# Patient Record
Sex: Female | Born: 1975 | Race: Asian | Hispanic: No | Marital: Married | Smoking: Never smoker
Health system: Southern US, Community
[De-identification: ages and names within clinical notes are randomized; demographics above are authoritative.]

## PROBLEM LIST (undated history)

## (undated) HISTORY — PX: ABDOMINAL HYSTERECTOMY: SHX81

---

## 2006-09-30 ENCOUNTER — Emergency Department (HOSPITAL_COMMUNITY): Admission: EM | Admit: 2006-09-30 | Discharge: 2006-09-30 | Payer: Self-pay | Admitting: Family Medicine

## 2007-04-07 ENCOUNTER — Emergency Department (HOSPITAL_COMMUNITY): Admission: EM | Admit: 2007-04-07 | Discharge: 2007-04-07 | Payer: Self-pay | Admitting: Family Medicine

## 2008-04-30 ENCOUNTER — Emergency Department (HOSPITAL_COMMUNITY): Admission: EM | Admit: 2008-04-30 | Discharge: 2008-04-30 | Payer: Self-pay | Admitting: Family Medicine

## 2008-12-10 ENCOUNTER — Emergency Department (HOSPITAL_COMMUNITY): Admission: EM | Admit: 2008-12-10 | Discharge: 2008-12-10 | Payer: Self-pay | Admitting: Family Medicine

## 2009-08-05 ENCOUNTER — Emergency Department (HOSPITAL_COMMUNITY): Admission: EM | Admit: 2009-08-05 | Discharge: 2009-08-05 | Payer: Self-pay | Admitting: Family Medicine

## 2009-10-07 ENCOUNTER — Emergency Department (HOSPITAL_COMMUNITY): Admission: EM | Admit: 2009-10-07 | Discharge: 2009-10-07 | Payer: Self-pay | Admitting: Family Medicine

## 2010-04-14 ENCOUNTER — Emergency Department (HOSPITAL_COMMUNITY)
Admission: EM | Admit: 2010-04-14 | Discharge: 2010-04-14 | Payer: Self-pay | Source: Home / Self Care | Admitting: Emergency Medicine

## 2010-04-21 ENCOUNTER — Emergency Department (HOSPITAL_COMMUNITY)
Admission: EM | Admit: 2010-04-21 | Discharge: 2010-04-21 | Payer: Self-pay | Source: Home / Self Care | Admitting: Family Medicine

## 2010-04-21 LAB — POCT URINALYSIS DIPSTICK
Bilirubin Urine: NEGATIVE
Ketones, ur: NEGATIVE mg/dL
Urine Glucose, Fasting: NEGATIVE mg/dL

## 2010-04-21 LAB — POCT PREGNANCY, URINE: Preg Test, Ur: NEGATIVE

## 2010-06-10 LAB — POCT RAPID STREP A (OFFICE): Streptococcus, Group A Screen (Direct): NEGATIVE

## 2010-06-13 LAB — URINE CULTURE: Culture: NO GROWTH

## 2010-06-13 LAB — POCT URINALYSIS DIP (DEVICE)
Glucose, UA: 100 mg/dL — AB
Nitrite: NEGATIVE
Urobilinogen, UA: 4 mg/dL — ABNORMAL HIGH (ref 0.0–1.0)

## 2010-06-13 LAB — GLUCOSE, CAPILLARY: Glucose-Capillary: 133 mg/dL — ABNORMAL HIGH (ref 70–99)

## 2010-06-13 LAB — POCT PREGNANCY, URINE: Preg Test, Ur: NEGATIVE

## 2010-08-07 ENCOUNTER — Inpatient Hospital Stay (INDEPENDENT_AMBULATORY_CARE_PROVIDER_SITE_OTHER)
Admission: RE | Admit: 2010-08-07 | Discharge: 2010-08-07 | Disposition: A | Discharge status: Home / Self Care | Payer: Managed Care, Other (non HMO) | Source: Ambulatory Visit | Attending: Family Medicine | Admitting: Family Medicine

## 2010-08-07 DIAGNOSIS — K5289 Other specified noninfective gastroenteritis and colitis: Secondary | ICD-10-CM

## 2010-08-07 LAB — POCT URINALYSIS DIP (DEVICE)
Glucose, UA: NEGATIVE mg/dL
Ketones, ur: 40 mg/dL — AB
Specific Gravity, Urine: 1.025 (ref 1.005–1.030)
Urobilinogen, UA: 1 mg/dL (ref 0.0–1.0)

## 2010-08-07 LAB — POCT PREGNANCY, URINE: Preg Test, Ur: NEGATIVE

## 2011-09-04 ENCOUNTER — Emergency Department (INDEPENDENT_AMBULATORY_CARE_PROVIDER_SITE_OTHER)
Admission: EM | Admit: 2011-09-04 | Discharge: 2011-09-04 | Disposition: A | Discharge status: Home / Self Care | Payer: Managed Care, Other (non HMO) | Source: Home / Self Care | Attending: Emergency Medicine | Admitting: Emergency Medicine

## 2011-09-04 ENCOUNTER — Encounter (HOSPITAL_COMMUNITY): Payer: Self-pay

## 2011-09-04 DIAGNOSIS — J209 Acute bronchitis, unspecified: Secondary | ICD-10-CM

## 2011-09-04 DIAGNOSIS — J069 Acute upper respiratory infection, unspecified: Secondary | ICD-10-CM

## 2011-09-04 MED ORDER — AZITHROMYCIN 250 MG PO TABS: ORAL_TABLET | ORAL | Status: AC

## 2011-09-04 MED ORDER — BENZONATATE 200 MG PO CAPS: 200.0000 mg | ORAL_CAPSULE | Freq: Three times a day (TID) | ORAL | Status: AC | PRN

## 2011-09-04 MED ORDER — FEXOFENADINE-PSEUDOEPHED ER 60-120 MG PO TB12: 1.0000 | ORAL_TABLET | Freq: Two times a day (BID) | ORAL | Status: DC

## 2011-09-04 NOTE — ED Notes (Signed)
C/o cough, congestion, ST, chills, sneezing since last week

## 2011-09-04 NOTE — Discharge Instructions (Signed)
If you have no primary doctor, here are some resources that may be helpful:  Medicaid-accepting [facility] Providers:   - [facility]- [address]      [phone]      Mon-Fri 9am-7pm, Sat 9am-1pm   - [facility]- [address] [place], [place]      [phone]   - [facility]- [address] [place]      [phone]   [facility]- [address]      [phone]   - Renaye Rakers- [address] [place], [address]      [phone]      Only accepts [place] patients       after they have her name applied to their card   Self Pay (no insurance) in [place]:   - Sickle Cell Patients: Dr Willey Blade, [facility]      [address] [place]      [phone]   - [phone]   - Physician Referral Service- [phone]   - [facility]- [address] [place]      [phone]   [facility] [place]- [address]   - [facility]- see information above      (Speak to [employer] if you do not have insurance)   - [facility]- [address] [place]      [phone]   - [facility] [place]- [address]      [phone]   - [facility]- [address]      [phone]   - Dr Julio Sicks-  [address], [place]      [phone]   - [facility]- [address]      [phone]   - [facility]- [address]      [phone]      Also [address]      [phone]   - [facility]- [address]      [phone]      1st and 3rd Saturday every month, 10am-1pm Other agencies that provide inexpensive medical care:    [facility]  [phone]    [facility]  [phone]    [facility]  [phone]    [facility]  [phone]    [facility]  [phone]  General Information: Finding a doctor when you do  not have health insurance can be tricky. Although you are not limited by an insurance plan, you are of course limited by her finances and how much but he can pay out of pocket.  What are your options if you don't have health insurance?   1) Find a Librarian, academic and Pay Out of Pocket Although you won't have to find out who is covered by your insurance plan, it is a good idea to ask around and get recommendations. You will then need to call the office and see if the doctor you have chosen will accept you as a new patient and what types of options they offer for patients who are self-pay. Some doctors offer discounts or will set up payment plans for their patients who do not have insurance, but you will need to ask so you aren't surprised when you get to your appointment.  2) Contact Your Local Health Department Not all health departments have doctors that can see patients for sick visits, but many do, so it is worth a call to see if yours does. If you don't know where your local health department is, you can check in your phone book. The CDC also has a tool to help you locate your state's health department, and many state websites also have listings of all of their local health departments.  3) Find a Walk-in Clinic If your illness is not likely to be very severe or complicated, you may want to try a walk in clinic. These are popping up all over the country in pharmacies, drugstores, and shopping centers. They're usually staffed by nurse practitioners or physician assistants that have been trained to treat common illnesses and complaints. They're usually fairly quick and inexpensive. However, if you have serious medical issues or chronic medical problems, these are probably not your best option   Most upper respiratory infections are caused by viruses and do not require antibiotics.  We try to save the antibiotics for when we really need them to avoid resistance.  This does not mean that there is nothing that  can be done.  Here are a few hints about things that can be done at home to get over an upper respiratory infection quicker:  Get extra sleep and extra fluids.  Get 7 to 9 hours of sleep per night and 6 to 8 glasses of water a day.  Getting extra sleep keeps the immune system from getting run down.  Most people with an upper respiratory infection are a little dehydrated.  The extra fluids also keep the secretions liquified and easier to deal with.  Also, get extra vitamin C.  4000 mg per day is the recommended dose. For the aches, headache, and fever, acetaminophen or ibuprofen are helpful.  These can be alternated every 4 hours.  People with liver disease should avoid large amounts of acetaminophen, and people with ulcer disease, gastroesophageal reflux, gastritis, congestive heart failure, chronic kidney disease, coronary artery disease and the elderly should avoid ibuprofen. For nasal congestion try Mucinex-D, or if you're having lots of sneezing or copious clear nasal drainage Allegra-D-24 hour.  A Saline nasal spray such as Ocean Spray can also help as can decongestant sprays such as Afrin, but you should not use the decongestant sprays for more than 3 or 4 days since they can be habituating.  If nasal dryness is a problem, Ayr Nasal Gel can help moisturize your nasal passages.  Breath Rite nasal strips can also offer a non-drug alternative treatment to nasal congestion, especially at night. For people with symptoms of sinusitis, sleeping with your head elevated can be helpful.  For sinus pain, moist, hot compresses to the face may provide some relief.  Many people find that inhaling steam as in a shower or from a pot of steaming water can help. For sore throat, zinc containing lozenges such as Cold-Eze or Zicam are helpful.  Zinc helps to fight infection and has a mild astringent effect that relieves the sore, achey throat.  Hot salt water gargles (8 oz of hot water, 1/2 tsp of table salt, and a pinch of  baking soda) can give relief as well as hot beverages such as hot tea. For the cough, old time remedies such as honey or honey and lemon are tried and true.  Over the counter cough syrups such as Delsym 2 tsp every 12 hours can help as  well.  It has also been found recently that Aleve can help control a cough.  The dose is 1 to 2 tablets twice daily with food.  This can be combined with Delsym. (Note, if you are taking ibuprofen, you should not take Aleve as well--take one or the other.)  It's important when you have an upper respiratory infection not to pass the infection to others.  This involves being very careful about the following:  Frequent hand washing or use of hand sanitizer, especially after coughing, sneezing, blowing your nose or touching your face, nose or eyes. Do not shake hands or touch anyone and try to avoid touching surfaces that other people use such as doorknobs, shopping carts, telephones and computer keyboards. Use tissues and dispose of them properly in a garbage can or ziplock bag. Cough into your sleeve. Do not let others eat or drink after you.  It's also important to recognize the signs of serious illness and get evaluated if they occur: Any respiratory infection that lasts more than 7 to 10 days.  Yellow nasal drainage and sputum are not reliable indicators of a bacterial infection, but if they last for more than 1 week, see your doctor. Fever and sore throat can indicate strep. Fever and cough can indicate influenza or pneumonia. Any kind of severe symptom such as difficulty breathing, intractable vomiting, or severe pain should prompt you to see a doctor as soon as possible.   Your body's immune system is really the thing that will get rid of this infection.  Your immune system is comprised of 2 types of specialized cells called T cells and B cells.  T cells coordinate the array of cells in your body that engulf invading bacteria or viruses while B cells orchestrate  the production of antibodies that neutralize infection.  Anything we do or any medications we give you, will just strengthen your immune system or help it clear up the infection quicker.  Here are a few helpful hints to improve your immune system to help overcome this illness or to prevent future infections:  A few vitamins can improve the health of your immune system.  That's why your diet should include plenty of fruits, vegetables, fish, nuts, and whole grains.  Vitamin A and bet-carotene can increase the cells that fight infections (T cells and B cells).  Vitamin A is abundant in dark greens and orange vegetables such as spinach, greens, sweet potatoes, and carrots.  Vitamin B6 contributes to the maturation of white blood cells, the cells that fight disease.  Foods with vitamin B6 include cold cereal and bananas.  Vitamin C is credited with preventing colds because it increases white blood cells and also prevents cellular damage.  Citrus fruits, peaches and green and red bell peppers are all hight in vitamin C.  Vitamin E is an anti-oxidant that encourages the production of natural killer cells which reject foreign invaders and B cells that produce antibodies.  Foods high in vitamin E include wheat germ, nuts and seeds.  Foods high in omega-3 fatty acids found in foods like salmon, tuna and mackerel boost your immune system and help cells to engulf and absorb germs.  Probiotics are good bacteria that increase your T cells.  These can be found in yogurt and are available in supplements such as Culturelle or Align.  Moderate exercise increases the strength of your immune system and your ability to recover from illness.  I suggest 3 to 5 moderate intensity 30 minute workouts  per week.    Sleep is another component of maintaining a strong immune system.  It enables your body to recuperate from the day's activities, stress and work.  My recommendation is to get between 7 and 9 hours of sleep per  night.  If you smoke, try to quit completely or at least cut down.  Drink alcohol only in moderation if at all.  No more than 2 drinks daily for men or 1 for women.  Get a flu vaccine early in the fall or if you have not gotten one yet, once this illness has run its course.  If you are over 65, a smoker, or an asthmatic, get a pneumococcal vaccine.  My final recommendation is to maintain a healthy weight.  Excess weight can impair the immune system by interfering with the way the immune system deals with invading viruses or bacteria.

## 2011-09-04 NOTE — ED Provider Notes (Signed)
Chief Complaint  Patient presents with  . Cough    History of Present Illness:   The patient is a 36 year old female who has had a three-day history of dry, nonproductive cough, aching in her chest when she coughs, dizziness, sneezing, nasal congestion, rhinorrhea, low-grade fever, sore throat, right ear pain, and headache. She denies any wheezing, nausea, vomiting, or diarrhea.  Review of Systems:  Other than noted above, the patient denies any of the following symptoms. Systemic:  No fever, chills, sweats, fatigue, myalgias, headache, or anorexia. Eye:  No redness, pain or drainage. ENT:  No earache, ear congestion, nasal congestion, sneezing, rhinorrhea, sinus pressure, sinus pain, post nasal drip, or sore throat. Lungs:  No cough, sputum production, wheezing, shortness of breath, or chest pain. GI:  No abdominal pain, nausea, vomiting, or diarrhea. Skin:  No rash or itching.  PMFSH:  Past medical history, family history, social history, meds, and allergies were reviewed.  Physical Exam:   Vital signs:  BP 107/70  Pulse 64  Temp(Src) 97.9 F (36.6 C) (Oral)  Resp 18  SpO2 97%  LMP 08/29/2011 General:  Alert, in no distress. Eye:  No conjunctival injection or drainage. Lids were normal. ENT:  TMs and canals were normal, without erythema or inflammation.  Nasal mucosa was clear and uncongested, without drainage.  Mucous membranes were moist.  Pharynx was clear, without exudate or drainage.  There were no oral ulcerations or lesions. Neck:  Supple, no adenopathy, tenderness or mass. Lungs:  No respiratory distress.  Lungs were clear to auscultation, without wheezes, rales or rhonchi.  Breath sounds were clear and equal bilaterally. Lungs were resonant to percussion.  No egophony. Heart:  Regular rhythm, without gallops, murmers or rubs. Skin:  Clear, warm, and dry, without rash or lesions.  Assessment:  The primary encounter diagnosis was Acute bronchitis. A diagnosis of Viral upper  respiratory tract infection was also pertinent to this visit.  Plan:   1.  The following meds were prescribed:   New Prescriptions   AZITHROMYCIN (ZITHROMAX Z-PAK) 250 MG TABLET    Take as directed.   BENZONATATE (TESSALON) 200 MG CAPSULE    Take 1 capsule (200 mg total) by mouth 3 (three) times daily as needed for cough.   FEXOFENADINE-PSEUDOEPHEDRINE (ALLEGRA-D) 60-120 MG PER TABLET    Take 1 tablet by mouth every 12 (twelve) hours.   2.  The patient was instructed in symptomatic care and handouts were given. 3.  The patient was told to return if becoming worse in any way, if no better in 3 or 4 days, and given some red flag symptoms that would indicate earlier return.   Reuben Likes, MD 09/04/11 [phone]

## 2011-11-01 ENCOUNTER — Encounter (HOSPITAL_COMMUNITY): Payer: Self-pay | Admitting: *Deleted

## 2011-11-01 ENCOUNTER — Emergency Department (INDEPENDENT_AMBULATORY_CARE_PROVIDER_SITE_OTHER)
Admission: EM | Admit: 2011-11-01 | Discharge: 2011-11-01 | Disposition: A | Discharge status: Home / Self Care | Payer: Managed Care, Other (non HMO) | Source: Home / Self Care | Attending: Emergency Medicine | Admitting: Emergency Medicine

## 2011-11-01 DIAGNOSIS — N390 Urinary tract infection, site not specified: Secondary | ICD-10-CM

## 2011-11-01 LAB — POCT URINALYSIS DIP (DEVICE)
Glucose, UA: NEGATIVE mg/dL
Specific Gravity, Urine: 1.025 (ref 1.005–1.030)
Urobilinogen, UA: 1 mg/dL (ref 0.0–1.0)

## 2011-11-01 MED ORDER — NITROFURANTOIN MACROCRYSTAL 100 MG PO CAPS: 100.0000 mg | ORAL_CAPSULE | Freq: Two times a day (BID) | ORAL | Status: AC

## 2011-11-01 NOTE — ED Provider Notes (Signed)
History     CSN: [id number]  Arrival date & time 11/01/11  1545   First MD Initiated Contact with Patient 11/01/11 1616      Chief Complaint  Patient presents with  . Urinary Tract Infection    (Consider location/radiation/quality/duration/timing/severity/associated sxs/prior treatment) HPI Comments: Patient comes in complaining of lower abdominal pain as in pressure related to urination. burning and pressure with urination. Patient tries to urinate and urinates small amounts at a time. Denies any vomiting, fevers or flank pain. Patient is accompanied by her daughter that is acting as Customer service manager. She feels "is a lot of burning with urination". Patient denies any diarrheas, no vaginal discharge or bleeding.  Patient is a 36 y.o. female presenting with urinary tract infection. The history is provided by the patient and a relative. The history is limited by a language barrier. No language interpreter was used.  Urinary Tract Infection This is a new problem. The current episode started 2 days ago. The problem occurs constantly. The problem has been gradually worsening. Associated symptoms include abdominal pain. Pertinent negatives include no shortness of breath. Exacerbated by: Urinating. Nothing relieves the symptoms. She has tried nothing for the symptoms.    History reviewed. No pertinent past medical history.  Past Surgical History  Procedure Date  . Abdominal hysterectomy     Family History  Problem Relation Age of Onset  . Family history unknown: Yes    History  Substance Use Topics  . Smoking status: Never Smoker   . Smokeless tobacco: Not on file  . Alcohol Use: No    OB History    Grav Para Term Preterm Abortions TAB SAB Ect Mult Living                  Review of Systems  Constitutional: Negative for fever, chills, activity change, appetite change and fatigue.  Respiratory: Negative for shortness of breath.   Gastrointestinal: Positive for abdominal  pain. Negative for vomiting, blood in stool and rectal pain.  Genitourinary: Positive for dysuria, urgency and frequency. Negative for hematuria, flank pain, vaginal bleeding, vaginal discharge, enuresis, vaginal pain and dyspareunia.  Skin: Negative for rash.    Allergies  Review of patient's allergies indicates no known allergies.  Home Medications   Current Outpatient Rx  Name Route Sig Dispense Refill  . FEXOFENADINE-PSEUDOEPHED ER 60-120 MG PO TB12 Oral Take 1 tablet by mouth every 12 (twelve) hours. 30 tablet 0  . NITROFURANTOIN MACROCRYSTAL 100 MG PO CAPS Oral Take 1 capsule (100 mg total) by mouth 2 (two) times daily. 14 capsule 0    BP 103/72  Pulse 68  Temp 97.9 F (36.6 C) (Oral)  Resp 18  SpO2 100%  LMP 08/29/2011  Physical Exam  Nursing note and vitals reviewed. Constitutional: Vital signs are normal. She appears well-developed and well-nourished.  Non-toxic appearance. She does not have a sickly appearance. She does not appear ill. No distress.  HENT:  Head: Normocephalic.  Eyes: Conjunctivae are normal.  Neck: Normal range of motion. Neck supple.  Pulmonary/Chest: Effort normal.  Abdominal: She exhibits no mass. There is tenderness in the suprapubic area. There is no rigidity, no rebound, no guarding and no CVA tenderness.  Neurological: She is alert.  Skin: No erythema.    ED Course  Procedures (including critical care time)  Labs Reviewed  POCT URINALYSIS DIP (DEVICE) - Abnormal; Notable for the following:    Leukocytes, UA SMALL (*)  Biochemical Testing Only. Please order routine urinalysis from  main lab if confirmatory testing is needed.   All other components within normal limits   No results found.   1. Urinary tract infection       MDM   Patient was comfortable, afebrile minimal tenderness suprapubically. Symptomatic suggestive of a urinary tract infection. Will start patient on Macrodantin. I explained to daughter what symptoms should  warrant further evaluation if no improvement or worsening she should return for a recheck.       Jimmie Molly, MD 11/01/11 [phone]

## 2011-11-01 NOTE — ED Notes (Signed)
Pt reports lower abdomen pain and pain upon urination, only small amounts at a time per pt -with aid of daughter translating

## 2012-05-31 ENCOUNTER — Emergency Department (INDEPENDENT_AMBULATORY_CARE_PROVIDER_SITE_OTHER)
Admission: EM | Admit: 2012-05-31 | Discharge: 2012-05-31 | Disposition: A | Discharge status: Home / Self Care | Payer: Managed Care, Other (non HMO) | Source: Home / Self Care

## 2012-05-31 ENCOUNTER — Encounter (HOSPITAL_COMMUNITY): Payer: Self-pay | Admitting: *Deleted

## 2012-05-31 DIAGNOSIS — J309 Allergic rhinitis, unspecified: Secondary | ICD-10-CM

## 2012-05-31 MED ORDER — FEXOFENADINE-PSEUDOEPHED ER 60-120 MG PO TB12: 1.0000 | ORAL_TABLET | Freq: Two times a day (BID) | ORAL | Status: AC

## 2012-05-31 MED ORDER — FLUTICASONE PROPIONATE 50 MCG/ACT NA SUSP: 2.0000 | Freq: Every day | NASAL | Status: DC

## 2012-05-31 NOTE — ED Notes (Signed)
Patient Demographics  Denise Barron, is a 37 y.o. female  ZOX:[id number]  WJX:[id number]  DOB - 1975-07-20  Chief Complaint  Patient presents with  . URI        Subjective:   Denise Barron patient here with one-week history of runny nose, postnasal drip, cough, subjective fevers however no temperature here, no body aches no exposure to sick contacts. She has history of seasonal allergies in the past.  Objective:    Filed Vitals:   05/31/12 1459 05/31/12 1504  BP: 113/73 130/85  Pulse: 64 73  Temp: 97.8 F (36.6 C) 98.2 F (36.8 C)  TempSrc: Oral Oral  Resp: 17 17  SpO2: 98% 96%     Exam  Awake Alert, Oriented X 3, No new F.N deficits, Normal affect [place].AT,PERRAL, mild postnasal drip. Supple Neck,No JVD, No cervical lymphadenopathy appriciated.  Symmetrical Chest wall movement, Good air movement bilaterally, CTAB RRR,No Gallops,Rubs or new Murmurs, No Parasternal Heave +ve B.Sounds, Abd Soft, Non tender, No organomegaly appriciated, No rebound - guarding or rigidity. No Cyanosis, Clubbing or edema, No new Rash or bruise       Data Review   CBC No results found for this basename: WBC, HGB, HCT, PLT, MCV, MCH, MCHC, RDW, NEUTRABS, LYMPHSABS, MONOABS, EOSABS, BASOSABS, BANDABS, BANDSABD,  in the last 168 hours  Chemistries   No results found for this basename: NA, K, CL, CO2, GLUCOSE, BUN, CREATININE, GFRCGP, CALCIUM, MG, AST, ALT, ALKPHOS, BILITOT,  in the last 168 hours ------------------------------------------------------------------------------------------------------------------ No results found for this basename: HGBA1C,  in the last 72 hours ------------------------------------------------------------------------------------------------------------------ No results found for this basename: CHOL, HDL, LDLCALC, TRIG, CHOLHDL, LDLDIRECT,  in the last 72  hours ------------------------------------------------------------------------------------------------------------------ No results found for this basename: TSH, T4TOTAL, FREET3, T3FREE, THYROIDAB,  in the last 72 hours ------------------------------------------------------------------------------------------------------------------ No results found for this basename: VITAMINB12, FOLATE, FERRITIN, TIBC, IRON, RETICCTPCT,  in the last 72 hours  Coagulation profile  No results found for this basename: INR, PROTIME,  in the last 168 hours     Prior to Admission medications   Medication Sig Start Date End Date Taking? Authorizing Provider  fexofenadine-pseudoephedrine (ALLEGRA-D) 60-120 MG per tablet Take 1 tablet by mouth every 12 (twelve) hours. 05/31/12 05/31/13  Leroy Sea, MD  fluticasone (FLONASE) 50 MCG/ACT nasal spray Place 2 sprays into the nose daily. 05/31/12   Leroy Sea, MD     Assessment & Plan   Allergic rhinitis. She will be given a course of Flonase along with Allegra-D, no signs of infection at this time. Followup as needed with primary care physician.    Follow-up Information   Schedule an appointment as soon as possible for a visit with Primary care provider. (As needed)        Leroy Sea M.D on 05/31/2012 at 3:38 PM  Leroy Sea, MD 05/31/12 1539

## 2012-05-31 NOTE — ED Notes (Signed)
Patient complains of head and chest congestion with cough x 1 week. Patient states fever/chills, sinus drainage and sneezing.

## 2013-08-06 ENCOUNTER — Emergency Department (INDEPENDENT_AMBULATORY_CARE_PROVIDER_SITE_OTHER)
Admission: EM | Admit: 2013-08-06 | Discharge: 2013-08-06 | Disposition: A | Discharge status: Home / Self Care | Payer: Managed Care, Other (non HMO) | Source: Home / Self Care | Attending: Family Medicine | Admitting: Family Medicine

## 2013-08-06 ENCOUNTER — Encounter (HOSPITAL_COMMUNITY): Payer: Self-pay | Admitting: Emergency Medicine

## 2013-08-06 DIAGNOSIS — K2970 Gastritis, unspecified, without bleeding: Secondary | ICD-10-CM

## 2013-08-06 DIAGNOSIS — K2990 Gastroduodenitis, unspecified, without bleeding: Secondary | ICD-10-CM

## 2013-08-06 LAB — POCT URINALYSIS DIP (DEVICE)
BILIRUBIN URINE: NEGATIVE
GLUCOSE, UA: NEGATIVE mg/dL
Hgb urine dipstick: NEGATIVE
KETONES UR: NEGATIVE mg/dL
Nitrite: NEGATIVE
Protein, ur: NEGATIVE mg/dL
Specific Gravity, Urine: >=1.03 (ref 1.005–1.030)
Urobilinogen, UA: 2 mg/dL — ABNORMAL HIGH (ref 0.0–1.0)
pH: 7 (ref 5.0–8.0)

## 2013-08-06 LAB — COMPREHENSIVE METABOLIC PANEL
ALT: 12 U/L (ref 0–35)
AST: 20 U/L (ref 0–37)
Albumin: 4 g/dL (ref 3.5–5.2)
Alkaline Phosphatase: 67 U/L (ref 39–117)
BILIRUBIN TOTAL: 0.5 mg/dL (ref 0.3–1.2)
BUN: 19 mg/dL (ref 6–23)
CHLORIDE: 103 meq/L (ref 96–112)
CO2: 23 meq/L (ref 19–32)
CREATININE: 0.66 mg/dL (ref 0.50–1.10)
Calcium: 9.1 mg/dL (ref 8.4–10.5)
GFR calc Af Amer: >90 mL/min (ref >90)
Glucose, Bld: 78 mg/dL (ref 70–99)
Potassium: 3.9 mEq/L (ref 3.7–5.3)
Sodium: 137 mEq/L (ref 137–147)
Total Protein: 8.1 g/dL (ref 6.0–8.3)

## 2013-08-06 LAB — CBC
HCT: 38.6 % (ref 36.0–46.0)
Hemoglobin: 13.6 g/dL (ref 12.0–15.0)
MCH: 28.8 pg (ref 26.0–34.0)
MCHC: 35.2 g/dL (ref 30.0–36.0)
MCV: 81.8 fL (ref 78.0–100.0)
PLATELETS: 216 10*3/uL (ref 150–400)
RBC: 4.72 MIL/uL (ref 3.87–5.11)
RDW: 15.3 % (ref 11.5–15.5)
WBC: 10.8 10*3/uL — AB (ref 4.0–10.5)

## 2013-08-06 LAB — POCT PREGNANCY, URINE: PREG TEST UR: NEGATIVE

## 2013-08-06 LAB — LIPASE, BLOOD: LIPASE: 32 U/L (ref 11–59)

## 2013-08-06 MED ORDER — SUCRALFATE 1 G PO TABS: 1.0000 g | ORAL_TABLET | Freq: Three times a day (TID) | ORAL | Status: AC

## 2013-08-06 MED ORDER — OMEPRAZOLE 40 MG PO CPDR: 40.0000 mg | DELAYED_RELEASE_CAPSULE | Freq: Every day | ORAL | Status: AC

## 2013-08-06 MED ORDER — GI COCKTAIL ~~LOC~~
ORAL | Status: AC
  Filled 2013-08-06: qty 30

## 2013-08-06 MED ORDER — GI COCKTAIL ~~LOC~~
30.0000 mL | Freq: Once | ORAL | Status: AC
  Administered 2013-08-06: 30 mL via ORAL

## 2013-08-06 NOTE — ED Notes (Signed)
C/o  Epigastric pain.  Diarrhea.   Cold chills.  Fever.   Sneezing.  Runny nose.    No relief with otc meds.  Symptoms present x 1 wk.

## 2013-08-06 NOTE — ED Provider Notes (Addendum)
Denise Barron is a 38 y.o. female who presents to Urgent Care today for abdominal pain present for about one week. Patient notes epigastric abdominal pain worse after eating. This is associated with occasional diarrhea. She denies any fevers chills nausea vomiting. She denies any significant abdominal surgery except for a history of a bilateral tubal ligation. She has tried Advil which has helped some. She feels well otherwise. She denies any urinary frequency urgency or dysuria. She denies any vaginal discharge.   History reviewed. No pertinent past medical history. History  Substance Use Topics  . Smoking status: Never Smoker   . Smokeless tobacco: Not on file  . Alcohol Use: No   ROS as above Medications: No current facility-administered medications for this encounter.   Current Outpatient Prescriptions  Medication Sig Dispense Refill  . fluticasone (FLONASE) 50 MCG/ACT nasal spray Place 2 sprays into the nose daily.  1 g  2  . omeprazole (PRILOSEC) 40 MG capsule Take 1 capsule (40 mg total) by mouth daily.  30 capsule  1  . sucralfate (CARAFATE) 1 G tablet Take 1 tablet (1 g total) by mouth 4 (four) times daily -  with meals and at bedtime.  60 tablet  0    Exam:  BP 120/79  Pulse 60  Temp(Src) 97.7 F (36.5 C) (Oral)  Resp 18  SpO2 100%  LMP 04/21/2012 Gen: Well NAD HEENT: EOMI,  MMM Lungs: Normal work of breathing. CTABL Heart: RRR no MRG Abd: NABS, Soft. NT, ND Exts: Brisk capillary refill, warm and well perfused.   Patient was given a GI cocktail and had significant improvement in symptoms  Results for orders placed during the hospital encounter of 08/06/13 (from the past 24 hour(s))  COMPREHENSIVE METABOLIC PANEL     Status: None   Collection Time    08/06/13  2:52 PM      Result Value Ref Range   Sodium 137  137 - 147 mEq/L   Potassium 3.9  3.7 - 5.3 mEq/L   Chloride 103  96 - 112 mEq/L   CO2 23  19 - 32 mEq/L   Glucose, Bld 78  70 - 99 mg/dL   BUN 19  6 - 23  mg/dL   Creatinine, Ser 4.090.66  0.50 - 1.10 mg/dL   Calcium 9.1  8.4 - 81.110.5 mg/dL   Total Protein 8.1  6.0 - 8.3 g/dL   Albumin 4.0  3.5 - 5.2 g/dL   AST 20  0 - 37 U/L   ALT 12  0 - 35 U/L   Alkaline Phosphatase 67  39 - 117 U/L   Total Bilirubin 0.5  0.3 - 1.2 mg/dL   GFR calc non Af Amer >90  >90 mL/min   GFR calc Af Amer >90  >90 mL/min  CBC     Status: Abnormal   Collection Time    08/06/13  2:52 PM      Result Value Ref Range   WBC 10.8 (*) 4.0 - 10.5 K/uL   RBC 4.72  3.87 - 5.11 MIL/uL   Hemoglobin 13.6  12.0 - 15.0 g/dL   HCT 91.438.6  78.236.0 - 95.646.0 %   MCV 81.8  78.0 - 100.0 fL   MCH 28.8  26.0 - 34.0 pg   MCHC 35.2  30.0 - 36.0 g/dL   RDW 21.315.3  08.611.5 - 57.815.5 %   Platelets 216  150 - 400 K/uL  LIPASE, BLOOD     Status: None   Collection  Time    08/06/13  2:52 PM      Result Value Ref Range   Lipase 32  11 - 59 U/L  POCT URINALYSIS DIP (DEVICE)     Status: Abnormal   Collection Time    08/06/13  2:55 PM      Result Value Ref Range   Glucose, UA NEGATIVE  NEGATIVE mg/dL   Bilirubin Urine NEGATIVE  NEGATIVE   Ketones, ur NEGATIVE  NEGATIVE mg/dL   Specific Gravity, Urine >=1.030  1.005 - 1.030   Hgb urine dipstick NEGATIVE  NEGATIVE   pH 7.0  5.0 - 8.0   Protein, ur NEGATIVE  NEGATIVE mg/dL   Urobilinogen, UA 2.0 (*) 0.0 - 1.0 mg/dL   Nitrite NEGATIVE  NEGATIVE   Leukocytes, UA SMALL (*) NEGATIVE  POCT PREGNANCY, URINE     Status: None   Collection Time    08/06/13  3:11 PM      Result Value Ref Range   Preg Test, Ur NEGATIVE  NEGATIVE   No results found.  Assessment and Plan: 38 y.o. female with gastritis. Plan to treat with Carafate and omeprazole. Followup with primary care provider.  Urinalysis is mildly abnormal and likely contaminated. Urine culture pending. No treatment at this time.  Discussed warning signs or symptoms. Please see discharge instructions. Patient expresses understanding.    Rodolph BongEvan S Candiss Galeana, MD 08/06/13 1608  Rodolph BongEvan S Damon Baisch, MD 08/06/13  [phone]

## 2013-08-06 NOTE — Discharge Instructions (Signed)
Thank you for coming in today. Take omeprazole daily.  Take Carafate 4 times daily for at least one week.  Avoid ibuprofen aspirin or Aleve.    Vim D? Dy, Ng??i L?n (Gastritis, Adult) Vim d? dy l hi?n t??ng ?au nh?c v s?ng (vim) ? l?p nim m?c d? dy. Vim d? dy c th? pht tri?n nh? l m?t tnh tr?ng kh?i pht ??t ng?t (c?p tnh) ho?c ko di (m?n tnh). N?u vim d? dy khng ???c ?i?u tr?, b?nh c th? d?n ??n ch?y mu v lot d? dy.  NGUYN NHN Vim d? dy xu?t hi?n khi l?p nim m?c d? dy y?u ho?c b? t?n th??ng. Sau ?, d?ch tiu ha t? d? dy gy vim nim m?c d? dy b? suy y?u. Nim m?c d? dy c th? y?u ho?c b? t?n th??ng do nhi?m vi rt ho?c vi khu?n. M?t nhi?m trng do vi khu?n ph? bi?n l nhi?m trng do Helicobacter pylori. Vim d? dy c?ng c th? do u?ng r??u qu nhi?u, dng m?t s? lo?i thu?c nh?t ??nh ho?c c qu nhi?u axit trong d? dy. TRI?U CH?NG Trong m?t s? tr??ng h?p, khng c tri?u ch?ng. Khi c tri?u ch?ng, chng c th? bao g?m:  ?au ho?c c?m gic nng rt ? vng b?ng trn.  Bu?n nn.  Nn.  C?m gic kh ch?u do no sau khi ?n. CH?N ?ON Chuyn gia ch?m [place] s?c kh?e c th? nghi ng? b?n b? vim d? dy d?a vo cc tri?u ch?ng c?a b?n v khm th?c th?. ?? xc ??nh nguyn nhn vim d? dy, chuyn gia ch?m [place] s?c kh?e c th? th?c hi?n nh? sau:  Xt nghi?m mu ho?c phn ?? ki?m tra vi khu?n H pylori.  Soi d? dy. M?t ?ng m?ng, m?m (?n n?i soi) ???c lu?n xu?ng th?c qu?n v vo d? dy. ?n n?i soi c g?n ?n v camera ? ??u. Chuyn gia ch?m [place] s?c kh?e s? d?ng ?n n?i soi ?? xem bn trong d? dy.  L?y m?u m (sinh thi?t) t? d? dy ?? ki?m tra d??i knh hi?n vi. ?I?U TR? Ty thu?c vo nguyn nhn gy vim d? dy, thu?c c th? ???c k ??n. N?u b?n b? nhi?m trng do vi khu?n, ch?ng h?n nh? nhi?m trng do H pylori, khng sinh c th? ???c cho dng. N?u vim d? dy l do c qu nhi?u axit trong d? dy, thu?c ch?n H2 ho?c thu?c trung ha axit c th? ???c cho dng. Chuyn  gia ch?m [place] s?c kh?e c th? khuy?n ngh? b?n nn ng?ng dng atpirin, ibuprofen ho?c thu?c khng vim khng c steroid khc (NSAID). H??NG D?N CH?M [place] T?I NH  Ch? s? d?ng thu?c khng c?n k toa ho?c thu?c c?n k toa theo ch? d?n c?a chuyn gia ch?m [place] s?c kh?e.  N?u b?n ???c cho dng thu?c khng sinh, hy dng theo ch? d?n. Dng h?t thu?c ngay c? khi b?n b?t ??u c?m th?y ?? h?n.  U?ng ?? n??c ?? gi? cho n??c ti?u trong ho?c vng nh?t.  [place] Berryrnh cc lo?i th?c ph?m v ?? u?ng lm cho cc tri?u ch?ng t?i t? h?n, ch?ng h?n nh?:  Caffeine ho?c ?? u?ng c c?n.  S c la.  B?c h ho?c v? b?c h.  T?i v hnh ty.  Th?c ?n [place].  Tri cy h? cam, ch?ng h?n nh? cam, chanh hay chanh ty.  Cc th?c ?n c c chua, ch?ng h?n nh? n??c x?t, ?t, salsa (n??c x?t [place]) v bnh  pizza.  Cc lo?i th?c ?n chin xo v nhi?u ch?t bo.  ?n nh?ng b?a ?n nh?, th??ng xuyn h?n thay v cc b?a ?n l?n. HY NGAY L?P T?C ?I KHM N?U:  Phn c mu ?en ho?c ?? s?m.  B?n nn ra mu ho?c ch?t gi?ng nh? b?t c ph.  B?n khng th? gi? ch?t l?ng trong ng??i.  B?n b? ?au b?ng tr?m tr?ng h?n.  B?n b? s?t.  B?n khng c?m th?y kh?e h?n sau 1 tu?n.  B?n c b?t c? cu h?i ho?c th?c m?c no khc. ??M B?O B?N:  Hi?u cc h??ng d?n ny.  S? theo di tnh tr?ng c?a mnh.  S? yu c?u tr? gip ngay l?p t?c n?u b?n c?m th?y khng ?? ho?c tnh tr?ng tr?m tr?ng h?n. Document Released: 03/12/2005 Document Revised: 11/12/2012 [facility] Patient Information 2014 [place], [place]. Gastritis, Adult Gastritis is soreness and swelling (inflammation) of the lining of the stomach. Gastritis can develop as a sudden onset (acute) or long-term (chronic) condition. If gastritis is not treated, it can lead to stomach bleeding and ulcers. CAUSES  Gastritis occurs when the stomach lining is weak or damaged. Digestive juices from the stomach then inflame the weakened stomach lining. The stomach lining may be weak or damaged due to viral  or bacterial infections. One common bacterial infection is the Helicobacter pylori infection. Gastritis can also result from excessive alcohol consumption, taking certain medicines, or having too much acid in the stomach.  SYMPTOMS  In some cases, there are no symptoms. When symptoms are present, they may include:  Pain or a burning sensation in the upper abdomen.  Nausea.  Vomiting.  An uncomfortable feeling of fullness after eating. DIAGNOSIS  Your caregiver may suspect you have gastritis based on your symptoms and a physical exam. To determine the cause of your gastritis, your caregiver may perform the following:  Blood or stool tests to check for the H pylori bacterium.  Gastroscopy. A thin, flexible tube (endoscope) is passed down the esophagus and into the stomach. The endoscope has a light and camera on the end. Your caregiver uses the endoscope to view the inside of the stomach.  Taking a tissue sample (biopsy) from the stomach to examine under a microscope. TREATMENT  Depending on the cause of your gastritis, medicines may be prescribed. If you have a bacterial infection, such as an H pylori infection, antibiotics may be given. If your gastritis is caused by too much acid in the stomach, H2 blockers or antacids may be given. Your caregiver may recommend that you stop taking aspirin, ibuprofen, or other nonsteroidal anti-inflammatory drugs (NSAIDs). HOME CARE INSTRUCTIONS  Only take over-the-counter or prescription medicines as directed by your caregiver.  If you were given antibiotic medicines, take them as directed. Finish them even if you start to feel better.  Drink enough fluids to keep your urine clear or pale yellow.  Avoid foods and drinks that make your symptoms worse, such as:  Caffeine or alcoholic drinks.  Chocolate.  Peppermint or mint flavorings.  Garlic and onions.  Spicy foods.  Citrus fruits, such as oranges, lemons, or limes.  Tomato-based foods  such as sauce, chili, salsa, and pizza.  Fried and fatty foods.  Eat small, frequent meals instead of large meals. SEEK IMMEDIATE MEDICAL CARE IF:   You have black or dark red stools.  You vomit blood or material that looks like coffee grounds.  You are unable to keep fluids down.  Your abdominal pain gets worse.  You have a fever.  You do not feel better after 1 week.  You have any other questions or concerns. MAKE SURE YOU:  Understand these instructions.  Will watch your condition.  Will get help right away if you are not doing well or get worse. Document Released: 03/06/2001 Document Revised: 09/11/2011 Document Reviewed: 04/25/2011 [facility] Patient Information 2014 [place], [place].

## 2013-08-07 LAB — URINE CULTURE: Special Requests: NORMAL

## 2014-04-29 ENCOUNTER — Emergency Department (INDEPENDENT_AMBULATORY_CARE_PROVIDER_SITE_OTHER)
Admission: EM | Admit: 2014-04-29 | Discharge: 2014-04-29 | Disposition: A | Discharge status: Home / Self Care | Payer: Managed Care, Other (non HMO) | Source: Home / Self Care | Attending: Family Medicine | Admitting: Family Medicine

## 2014-04-29 ENCOUNTER — Encounter (HOSPITAL_COMMUNITY): Payer: Self-pay | Admitting: Emergency Medicine

## 2014-04-29 DIAGNOSIS — R0789 Other chest pain: Secondary | ICD-10-CM

## 2014-04-29 DIAGNOSIS — J069 Acute upper respiratory infection, unspecified: Secondary | ICD-10-CM

## 2014-04-29 MED ORDER — ONDANSETRON HCL 4 MG PO TABS: ORAL_TABLET | ORAL | Status: DC

## 2014-04-29 NOTE — Discharge Instructions (Signed)
Vim s?n s??n (Costochondritis) Vim s?n s??n, ?i khi ???c g?i l h?i ch?ng [place], l tnh tr?ng s?ng v kch ?ng (vim) cc m (s?n) n?i x??ng s??n v?i x??ng ng?c (x??ng ?c). N gy ?au ? ng?c v khu v?c x??ng s??n. Vim s?n s??n th??ng t? kh?i sau m?t th?i gian. C th? m?t t?i 6 tu?n ho?c lu h?n ?? b?nh ?? h?n, ??c bi?t l n?u qu v? khng th? h?n ch? cc ho?t ??ng c?a mnh. NGUYN NHN  M?t s? tr??ng h?p vim s?n s??n khng r nguyn nhn. Nh?ng nguyn nhn c th? c bao g?m:  T?n th??ng (ch?n th??ng).  T?p th? d?c ho?c ho?t ??ng ch?ng h?n nh? nng nh?c.  Ho d? d?i. D?U HI?U V TRI?U CH?NG  ?au v c?m gic ?au ? ng?c v khu v?c x??ng s??n.  ?au n?ng h?n khi ho ho?c th? su.  ?au n?ng h?n khi c cc c? ??ng ring bi?t. CH?N ?ON  Chuyn gia ch?m [place] s?c kh?e c?a qu v? s? khm th?c th? v h?i v? nh?ng tri?u ch?ng c?a qu v?. C th? ch?p X quang l?ng ng?c ho?c cc xt nghi?m khc ?? lo?i tr? nh?ng v?n ?? khc. ?I?U TR?  Vim s?n s??n th??ng t? kh?i sau m?t th?i gian. Chuyn gia ch?m  s?c kh?e c th? k ??n thu?c ?? gi?m ?au. H??NG D?N CH?M  T?I NH   Trnh ho?t ??ng th? l?c g?ng s?c. C? g?ng khng ko c?ng x??ng s??n trong khi ho?t ??ng bnh th??ng. ?i?u ny c th? bao g?m b?t k? ho?t ??ng no c s? d?ng c? ng?c, b?ng, v ? m?ng s??n, ??c bi?t l n?u s? d?ng cc v?t n?ng.  Ch??m ? vo vng b? ?nh h??ng trong 2 ngy ??u sau khi b?t ??u ?au.  Cho ? vo ti nh?a.  ?? kh?n t?m vo gi?a da v ti.  ?? ? l?nh trong kho?ng 20 pht, 2 - 3 l?n m?t ngy.  Ch? s? d?ng thu?c khng c?n k ??n ho?c thu?c c?n k ??n theo ch? d?n c?a chuyn gia ch?m  s?c kh?e. ?I KHM N?U:  Qu v? b? t?y ?? ho?c s?ng ? cc kh?p x??ng s??n. ?y l nh?ng d?u hi?u nhi?m trng.  C?n ?au c?a qu v? khng h?t m?c d ? ngh? ng?i ho?c dng thu?c. NGAY L?P T?C ?I KHM N?U:   C?n ?au c?a qu v? t?ng ln ho?c c?m th?y r?t kh ch?u.  Qu v? b? th? d?c ho?c kh th?.  Qu v? ho ra mu.  Qu v? b?  ?au ng?c n?ng h?n, ?? m? hi, ho?c nn m?a.  Qu v? b? s?t ho?c cc tri?u ch?ng ko di h?n 2 - 3 ngy.  Qu v? b? s?t v cc tri?u ch?ng c?a qu v? ??t nhin n?ng ln. ??M B?O QU V?:   Hi?u cc h??ng d?n ny.  S? theo di tnh tr?ng c?a mnh.  S? yu c?u tr? gip ngay l?p t?c n?u b?n c?m th?y khng kh?e ho?c th?y tr?m tr?ng h?n. Document Released: 12/20/2004 Document Revised: 12/31/2012 [facility] Patient Information 2015 [place]. This information is not intended to replace advice given to you by your health care provider. Make sure you discuss any questions you have with your health care provider.  Nhi?m Trng ???ng H H?p Trn, Ng??i L?n (Upper Respiratory Infection, Adult) Use lots of nasal saline frequently Claritin or Allegra for drainage Drink plenty of fluids Tylenol for chest pain. Nhi?m trng ???ng h  h?p trn (URI) ?i khi cn ???c g?i l c?m l?nh thng th??ng. ???ng h h?p trn bao g?m m?i, xoang, h?ng, kh qu?n v ph? qu?n. Ph? qu?n l ???ng h h?p d?n ??n ph?i. H?u h?t m?i ng??i c?i thi?n trong vng 1 tu?n, nh?ng cc tri?u ch?ng c th? ko di ??n 2 tu?n. Ho cn l?i c th? ko di th?m ch lu h?n.  NGUYN NHN Nhi?u vi rt khc nhau c th? ly nhi?m cc m lt ???ng h h?p trn. Cc m ny tr? nn b? kch thch, vim v th??ng tr? nn r?t ?m ??t. S? s?n sinh ch?t nh?n c?ng l ph? bi?n. C?m l?nh d? ly. B?n c th? d? dng ly vi rt cho ng??i khc khi ti?p xc b?ng mi?ng. ?i?u ny bao g?m hn, dng chung ly, ho ho?c h?t h?i. Ch?m vo mi?ng ho?c m?i b?n v sau ? ch?m vo m?t b? m?t, sau ? ng??i khc ch?m vo b? m?t ny c?ng c th? b? ly vi rt. TRI?U CH?NG Cc tri?u ch?ng th??ng pht tri?n 1-3 ngy sau khi b?n ti?p xc v?i vi rt c?m l?nh. Tri?u ch?ng ? m?i ng??i m?t khc. Cc tri?u ch?ng c th? bao g?m:  Ch?y n??c m?i.  H?t h?i.  Ngh?t m?i.  Kch thch xoang.  ?au h?ng.  M?t ti?ng (vim thanh qu?n).  Ho.  M?t m?i.  ?au nh?c c?.  ?n khng ngon  mi?ng.  ?au ??u.  S?t nh?. CH?N ?ON B?n c th? ch?n ?on b?nh c?m l?nh c?a chnh mnh d?a vo nh?ng tri?u ch?ng quen thu?c, v h?u h?t m?i ng??i b? c?m l?nh 2-3 l?n m?i n?m. Chuyn gia ch?m [place] s?c kh?e c?a b?n c th? xc nh?n ?i?u ny sau khi khm b?n. Quan tr?ng nh?t, chuyn gia ch?m [place] y t? c?a b?n c th? ki?m tra v ??m b?o r?ng cc tri?u ch?ng c?a b?n khng ph?i do m?t b?nh khc nh? vim h?ng, vim xoang, vim ph?i, hen suy?n ho?c vim ti?u thi?t. Khng c?n thi?t xt nghi?m mu, ki?m tra h?ng v ch?p X-quang ?? ch?n ?on c?m l?nh thng th??ng, nh?ng ?i khi chng c th? h?u ch trong vi?c lo?i tr? cc b?nh khc nghim tr?ng h?n. Chuyn gia ch?m [place] s?c kh?e c?a b?n s? quy?t ??nh c c?n thm cc xt nghi?m khc khng. R?I RO V BI?N CH?NG B?n c th? c nguy c? b? m?t tr??ng h?p nghim tr?ng h?n c?m l?nh thng th??ng n?u b?n ht thu?c l, c b?nh tim m?n tnh (nh? suy tim), ho?c b?nh ph?i (nh? hen suy?n), ho?c n?u b?n c h? th?ng mi?n d?ch suy y?u. Nh?ng ng??i r?t tr? v r?t gi c?ng c nguy c? nhi?m trng nghim tr?ng h?n. Vim xoang do vi khu?n, nhi?m trng tai gi?a, vim ph?i do vi khu?n c th? khi?n c?m l?nh thng th??ng ph?c t?p thm. C?m l?nh thng th??ng c th? lm tr?m tr?ng thm b?nh hen suy?n v t?c ngh?n ph?i m?n tnh (COPD). ?i khi, nh?ng bi?n ch?ng ny c th? yu c?u ch?m  y t? kh?n c?p v c th? ?e d?a tnh m?ng. PHNG NG?A Cch t?t nh?t ?? b?o v? trnh c?m l?nh l th?c hnh v? sinh t?t. Hessie Diener ti?p xc b?ng mi?ng ho?c b?ng tay v?i nh?ng ng??i c tri?u ch?ng c?m l?nh. R?a tay th??ng xuyn n?u ti?p xc x?y ra. Khng c b?ng ch?ng r rng r?ng vitamin C, vitamin E, Echinacea ho?c t?p th? d?c lm gi?m kh? n?ng pht tri?n c?m  l?nh. Tuy nhin, b?n lun nn ngh? ng?i nhi?u v c ch? ?? dinh d??ng t?t. ?I?U TR? ?i?u tr? nh?m gi?m tri?u ch?ng. Khng c thu?c ch?a. Khng sinh khng hi?u qu?, v nhi?m trng do vi rt gy ra, ch? khng ph?i do vi khu?n. ?i?u tr? c th? bao g?m:  U?ng nhi?u  n??c h?n. ?? u?ng th? thao cung c?p ch?t ?i?n gi?i, ???ng v cc ch?t l?ng c gi tr?Marland Kitchen  Th? s??ng m n??c nng ho?c h?i n??c (bnh phun h?i ho?c vi sen).  ?n sp g ho?c n??c canh trong khc v duy tr ch? ?? dinh d??ng t?t.  Ngh? ng?i th?t nhi?u.  S? d?ng thu?c [place] mi?ng ho?c thu?c u?ng ?? t?o c?m gic tho?i mi.  Ki?m sot s?t b?ng ibuprofen ho?c acetaminophen theo ch? d?n c?a chuyn gia ch?m [place] s?c kh?e.  T?ng c??ng s? d?ng thu?c d?ng ht n?u b?n b? b?nh hen suy?n. S? d?ng gel k?m v k?o dn k?m trong 24 gi? ??u tin sau khi b? c?m l?nh thng th??ng c th? rt ng?n th?i gian v lm gi?m b?t m?c ?? nghim tr?ng c?a cc tri?u ch?ng. Thu?c gi?m ?au c th? c tc d?ng v?i s?t, ?au nh?c c? b?p v ?au c? h?ng. Nhi?u lo?i thu?c khng c?n k ??n c s?n ?? ?i?u tr? t?c ngh?n v ch?y n??c m?i. Chuyn gia ch?m [place] s?c kh?e c?a b?n c th? ??a ra cc khuy?n co v c th? ?? ngh? s? d?ng thu?c x?t m?i ho?c ph?i cho cc tri?u ch?ng khc. H??NG D?N CH?M [place] T?I NH  Ch? s? d?ng thu?c khng c?n k toa ho?c thu?c c?n k toa ?? gi?m ?au, gi?m c?m gic kh ch?u ho?c h? s?t theo ch? d?n c?a chuyn gia ch?m [place] s?c kh?e c?a b?n.  S? d?ng my t?o ?? ?m s??ng m ?m ho?c ht h?i n??c t? m?t vi sen ?? t?ng ?? ?m khng kh. ?i?u ny c th? ti?t ?m v gip d? th? h?n.  U?ng ?? n??c v dung d?ch ?? n??c ti?u trong ho?c c mu vng nh?t.  Ngh? ng?i theo nhu c?u.  Tr? l?i lm vi?c khi nhi?t ?? c?a b?n ? tr? l?i bnh th??ng ho?c chuyn gia ch?m [place] y t? [place] b?n lm nh? v?y. B?n c th? c?n ? nh lu h?n ?? trnh ly nhi?m cho ng??i khc. B?n c?ng c th? s? d?ng m?t n? v r?a tay c?n th?n ?? ng?n ng?a ly lan vi rt. HY ?I KHM N?U:  Sau vi ngy ??u tin, b?n c?m th?y t? h?n ch? khng ph?i l t?t h?n.  B?n c?n l?i khuyn c?a chuyn gia ch?m  s?c kh?e v? thu?c ?? ki?m sot cc tri?u ch?ng.  B?n b? ?n l?nh, kh th? tr?m tr?ng h?n ho?c ??m mu nu ho?c ??. ?y c th? l nh?ng d?u hi?u c?a vim ph?i.  B?n  b? ch?y n??c m?i mu vng ho?c nu ho?c ?au ? m?t, ??c bi?t l khi b?n u?n cong v? pha tr??c. ?y c th? l nh?ng d?u hi?u c?a vim xoang.  B?n b? s?t, s?ng h?ch c?, ?au khi nu?t ho?c cc vng mu tr?ng ? m?t sau c?a c? h?ng. ?y c th? l nh?ng d?u hi?u c?a vim h?ng. HY NGAY L?P T?C ?I KHM N?U:  B?n b? s?t.  B?n b? nh?c ??u d? d?i ho?c dai d?ng, ?au tai, ?au xoang ho?c ?au ng?c.  B?n b? th? kh kh, ho ko di, ho ra  mu ho?c c s? thay ??i trong ch?t nh?n thng th??ng c?a b?n (n?u b?n b? b?nh ph?i m?n tnh).  B?n b? ?au c? ho?c c? c?ng. Document Released: 09/25/2010 Document Revised: 11/12/2012 [facility] Patient Information 2015 [place]. This information is not intended to replace advice given to you by your health care provider. Make sure you discuss any questions you have with your health care provider.

## 2014-04-29 NOTE — ED Provider Notes (Signed)
CSN: [id number]     Arrival date & time 04/29/14  1824 History   First MD Initiated Contact with Patient 04/29/14 1844     Chief Complaint  Patient presents with  . Shortness of Breath   (Consider location/radiation/quality/duration/timing/severity/associated sxs/prior Treatment) HPI Comments: 39 year old [place] female is accompanied by her son. Both speak partial [place] and [place]. The patient is complaining of a cough, runny nose, sneezing and feeling cold around 12:00 at night. She also has minor intermittent sore throat. Denies fever. Yesterday she developed pain across anterior chest and down the sternum. It is worse when taking a deep breath.   History reviewed. No pertinent past medical history. Past Surgical History  Procedure Laterality Date  . Abdominal hysterectomy     No family history on file. History  Substance Use Topics  . Smoking status: Never Smoker   . Smokeless tobacco: Not on file  . Alcohol Use: No   OB History    No data available     Review of Systems  Constitutional: Positive for activity change. Negative for fever and fatigue.  HENT: Positive for congestion, postnasal drip, rhinorrhea, sneezing and sore throat. Negative for ear pain.   Respiratory: Positive for cough and shortness of breath.   Cardiovascular: Positive for chest pain. Negative for leg swelling.  Gastrointestinal: Positive for nausea.  Genitourinary: Negative.   Neurological: Negative for syncope and headaches.    Allergies  Review of patient's allergies indicates no known allergies.  Home Medications   Prior to Admission medications   Medication Sig Start Date End Date Taking? Authorizing Provider  fluticasone (FLONASE) 50 MCG/ACT nasal spray Place 2 sprays into the nose daily. 05/31/12   Leroy SeaPrashant K Singh, MD  omeprazole (PRILOSEC) 40 MG capsule Take 1 capsule (40 mg total) by mouth daily. 08/06/13   Rodolph BongEvan S Corey, MD  ondansetron (ZOFRAN) 4 MG tablet One tablet po q 6h prn  stomach sickness 04/29/14   Hayden Rasmussenavid Jazariah Teall, NP  sucralfate (CARAFATE) 1 G tablet Take 1 tablet (1 g total) by mouth 4 (four) times daily -  with meals and at bedtime. 08/06/13   Rodolph BongEvan S Corey, MD   BP 116/82 mmHg  Pulse 71  Temp(Src) 98.4 F (36.9 C) (Oral)  Resp 22  SpO2 100%  LMP 04/22/2014 Physical Exam  Constitutional: She appears well-developed. No distress.  HENT:  Mouth/Throat: No oropharyngeal exudate.  Bilateral TMs are normal Oropharynx with minor erythema and moderate amount of clear PND.  Eyes: Conjunctivae and EOM are normal.  Neck: Normal range of motion. Neck supple.  Cardiovascular: Normal rate, regular rhythm, normal heart sounds and intact distal pulses.   Pulmonary/Chest: Effort normal and breath sounds normal. No respiratory distress. She has no wheezes. She has no rales. She exhibits tenderness.  Lungs are perfectly clear. His but when it toward faces are equal. No cough during inspiration. Palpation of the anterior chest and parasternal borders reproduce moderate to severe chest wall pain. She states this is the same pain for which she originally complained.    Abdominal: Soft. Bowel sounds are normal. There is no tenderness.  Musculoskeletal: She exhibits no edema or tenderness.  Lymphadenopathy:    She has no cervical adenopathy.  Neurological: She is alert. She exhibits normal muscle tone.  Skin: Skin is warm and dry. No rash noted.  Psychiatric: She has a normal mood and affect.  Nursing note and vitals reviewed.   ED Course  Procedures (including critical care time) Labs Review Labs Reviewed -  No data to display  Imaging Review No results found. EKG: NSR, no ectopy, nl axis, no ischemic changes  MDM   1. URI (upper respiratory infection)   2. Chest wall pain    zofran for nausea Use lots of nasal saline frequently Claritin or Allegra for drainage Drink plenty of fluids Tylenol for chest pain.     Hayden Rasmussen, NP 04/29/14 1911  Hayden Rasmussen,  NP 04/29/14 1911

## 2014-04-29 NOTE — ED Notes (Signed)
Complains of cough and shortness of breath.  Coughing makes upper chest hurt.  Pain also in center epigastric area.  Patient reports it is hard to breathe when lying down .

## 2016-06-25 ENCOUNTER — Other Ambulatory Visit: Payer: Self-pay | Admitting: Family Medicine

## 2016-06-25 ENCOUNTER — Other Ambulatory Visit (HOSPITAL_COMMUNITY)
Admission: RE | Admit: 2016-06-25 | Discharge: 2016-06-25 | Disposition: A | Discharge status: Home / Self Care | Payer: Managed Care, Other (non HMO) | Source: Ambulatory Visit | Attending: Family Medicine | Admitting: Family Medicine

## 2016-06-25 DIAGNOSIS — Z113 Encounter for screening for infections with a predominantly sexual mode of transmission: Secondary | ICD-10-CM | POA: Insufficient documentation

## 2016-06-28 LAB — URINE CYTOLOGY ANCILLARY ONLY
Bacterial vaginitis: NEGATIVE
CHLAMYDIA, DNA PROBE: NEGATIVE
Candida vaginitis: NEGATIVE
NEISSERIA GONORRHEA: NEGATIVE
TRICH (WINDOWPATH): NEGATIVE

## 2017-04-04 ENCOUNTER — Other Ambulatory Visit: Payer: Self-pay | Admitting: Family Medicine

## 2017-04-04 ENCOUNTER — Other Ambulatory Visit (HOSPITAL_COMMUNITY)
Admission: RE | Admit: 2017-04-04 | Discharge: 2017-04-04 | Disposition: A | Discharge status: Home / Self Care | Payer: 59 | Source: Ambulatory Visit | Attending: Family Medicine | Admitting: Family Medicine

## 2017-04-04 DIAGNOSIS — Z124 Encounter for screening for malignant neoplasm of cervix: Secondary | ICD-10-CM | POA: Insufficient documentation

## 2017-04-06 LAB — URINE CYTOLOGY ANCILLARY ONLY
CHLAMYDIA, DNA PROBE: NEGATIVE
Neisseria Gonorrhea: NEGATIVE
Trichomonas: NEGATIVE

## 2017-04-10 LAB — CYTOLOGY - PAP
DIAGNOSIS: NEGATIVE
HPV: NOT DETECTED

## 2017-09-02 ENCOUNTER — Other Ambulatory Visit: Payer: Self-pay | Admitting: Family Medicine

## 2017-09-02 ENCOUNTER — Ambulatory Visit
Admission: RE | Admit: 2017-09-02 | Discharge: 2017-09-02 | Disposition: A | Discharge status: Home / Self Care | Payer: Managed Care, Other (non HMO) | Source: Ambulatory Visit | Attending: Family Medicine | Admitting: Family Medicine

## 2017-09-02 DIAGNOSIS — R109 Unspecified abdominal pain: Secondary | ICD-10-CM

## 2018-04-30 ENCOUNTER — Other Ambulatory Visit: Payer: Self-pay | Admitting: Family Medicine

## 2018-04-30 ENCOUNTER — Ambulatory Visit
Admission: RE | Admit: 2018-04-30 | Discharge: 2018-04-30 | Disposition: A | Discharge status: Home / Self Care | Payer: 59 | Source: Ambulatory Visit | Attending: Family Medicine | Admitting: Family Medicine

## 2018-04-30 DIAGNOSIS — M7731 Calcaneal spur, right foot: Secondary | ICD-10-CM

## 2020-09-21 IMAGING — CR DG FOOT COMPLETE 3+V*R*
3 series · 3 of 3 positions shown · non-contrast
Comparison: None.

CLINICAL DATA: RIGHT heel and foot pain for 3 months. No injury.

EXAM:
RIGHT FOOT COMPLETE - 3+ VIEW

[t foot ap right]
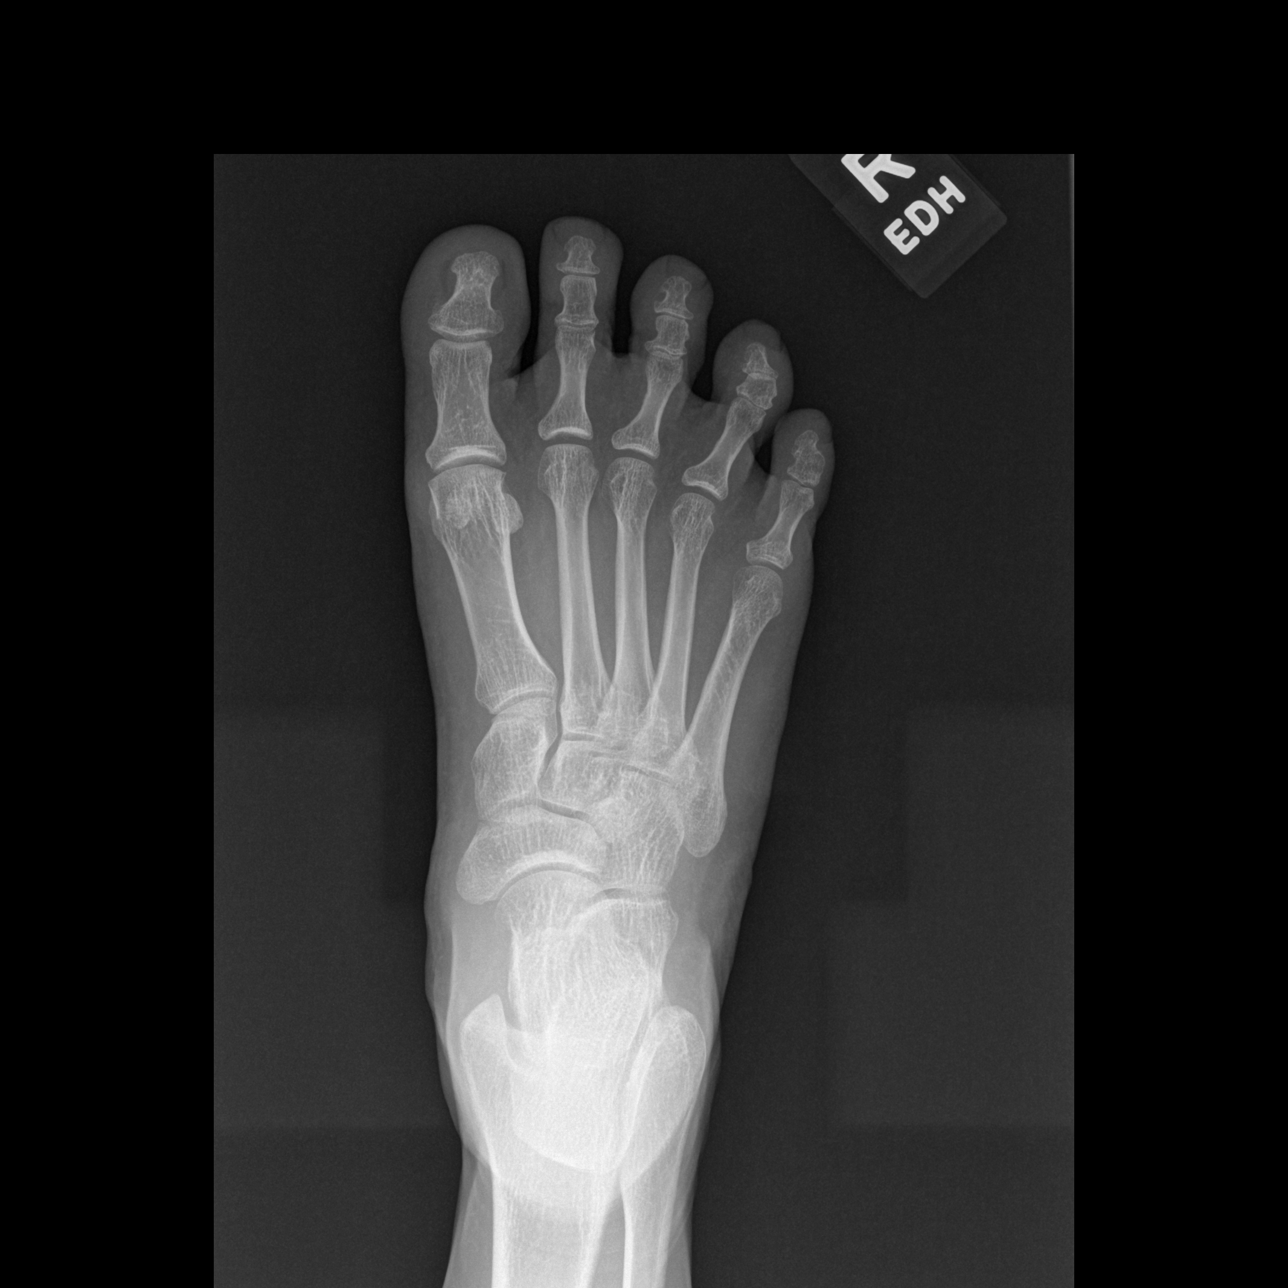

[t foot oblique right]
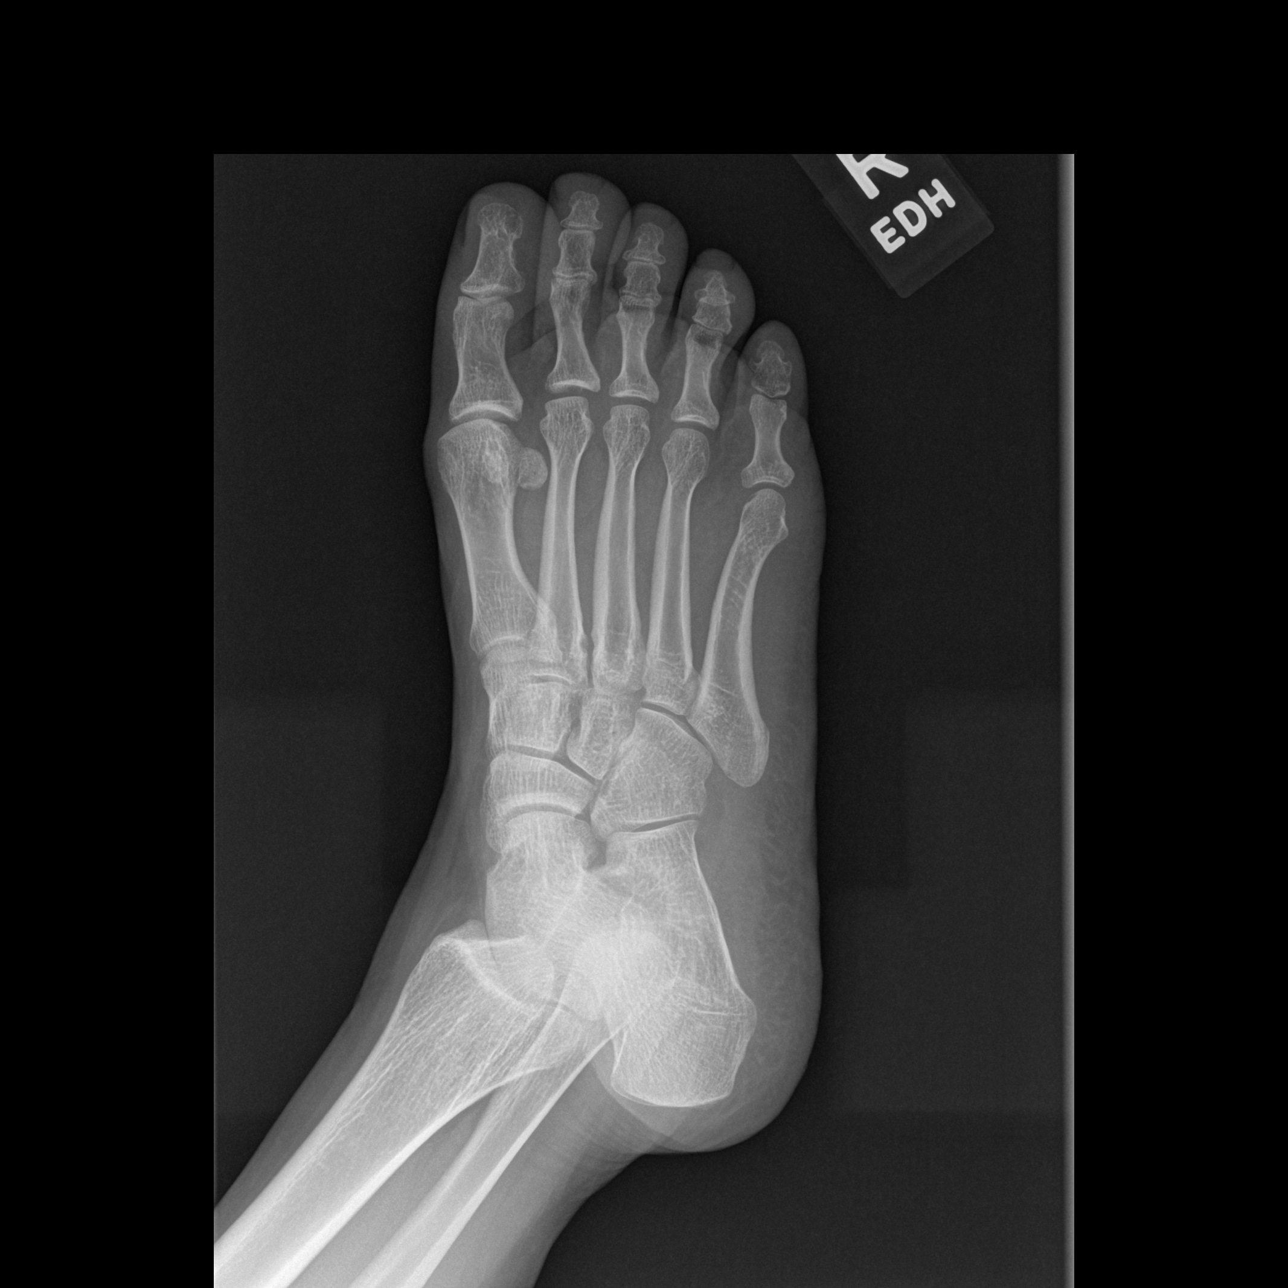

[t foot lat right]
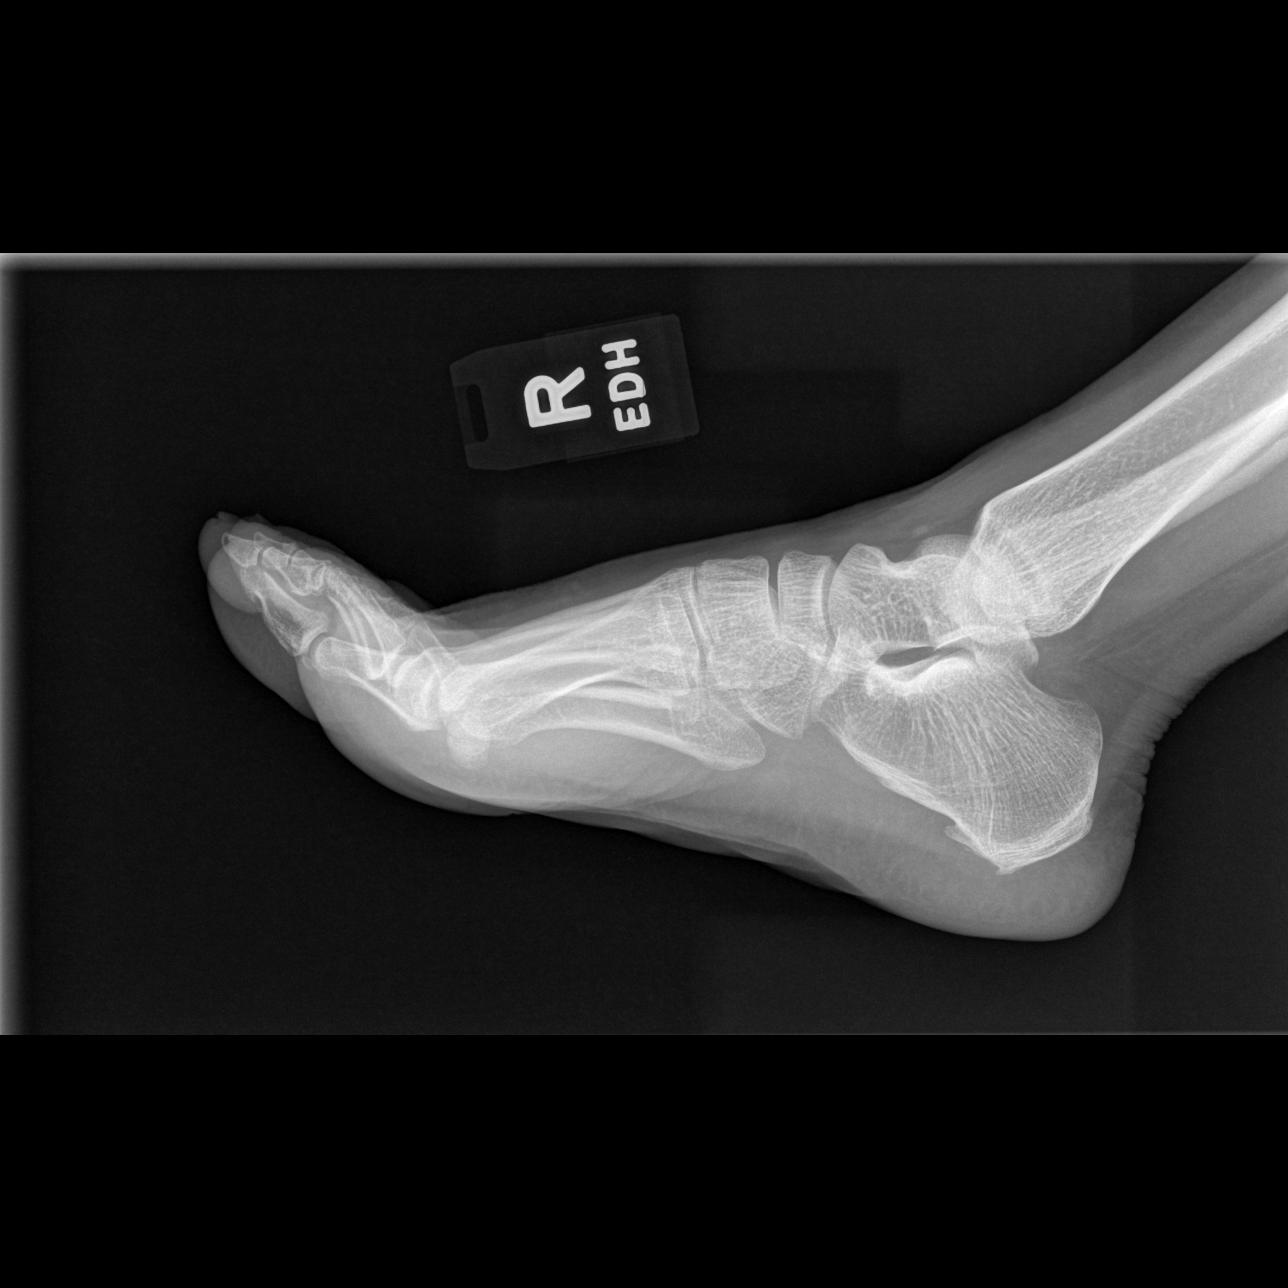

[3 of 3 positions shown; findings below may reference images not displayed]

FINDINGS: There is no evidence of fracture or dislocation. There is no
evidence of arthropathy or other worrisome focal bony abnormality.
Soft tissues are unremarkable. Minor plantar spur.
IMPRESSION: Minor plantar spur, otherwise normal exam.

## 2021-06-12 ENCOUNTER — Encounter (HOSPITAL_COMMUNITY): Payer: Self-pay | Admitting: *Deleted

## 2021-06-12 ENCOUNTER — Ambulatory Visit (HOSPITAL_COMMUNITY)
Admission: EM | Admit: 2021-06-12 | Discharge: 2021-06-12 | Disposition: A | Discharge status: Home / Self Care | Payer: 59 | Attending: Sports Medicine | Admitting: Sports Medicine

## 2021-06-12 ENCOUNTER — Other Ambulatory Visit: Payer: Self-pay

## 2021-06-12 DIAGNOSIS — Z28310 Unvaccinated for covid-19: Secondary | ICD-10-CM | POA: Diagnosis not present

## 2021-06-12 DIAGNOSIS — J069 Acute upper respiratory infection, unspecified: Secondary | ICD-10-CM | POA: Diagnosis not present

## 2021-06-12 DIAGNOSIS — R051 Acute cough: Secondary | ICD-10-CM | POA: Insufficient documentation

## 2021-06-12 DIAGNOSIS — J029 Acute pharyngitis, unspecified: Secondary | ICD-10-CM | POA: Insufficient documentation

## 2021-06-12 DIAGNOSIS — Z20822 Contact with and (suspected) exposure to covid-19: Secondary | ICD-10-CM | POA: Insufficient documentation

## 2021-06-12 LAB — POCT RAPID STREP A, ED / UC: Streptococcus, Group A Screen (Direct): NEGATIVE

## 2021-06-12 LAB — POC INFLUENZA A AND B ANTIGEN (URGENT CARE ONLY)
INFLUENZA A ANTIGEN, POC: NEGATIVE
INFLUENZA B ANTIGEN, POC: NEGATIVE

## 2021-06-12 MED ORDER — FLUTICASONE PROPIONATE 50 MCG/ACT NA SUSP: 1.0000 | Freq: Every day | NASAL | 0 refills | Status: DC

## 2021-06-12 MED ORDER — PROMETHAZINE-DM 6.25-15 MG/5ML PO SYRP: 5.0000 mL | ORAL_SOLUTION | Freq: Four times a day (QID) | ORAL | 0 refills | Status: DC | PRN

## 2021-06-12 NOTE — ED Provider Notes (Signed)
?[facility] ? ? ? ?CSN: [id number] ?Arrival date & time: 06/12/21  1229 ? ? ?  ? ?History   ?Chief Complaint ?Chief Complaint  ?Patient presents with  ? Cough  ? Headache  ? Fever  ? Sore Throat  ? ? ?HPI ?Denise Barron is a 46 y.o. female.  ? ?Patient presents today accompanied by her daughter who provided translation after they declined video interpreter.  She reports a 3-day history of URI symptoms including cough, fever, sore throat, body aches, headache.  Denies any chest pain, shortness of breath, nausea, vomiting.  She is having difficulty eating and drinking due to sore throat pain.  Denies any known sick contacts.  Reports pain is rated 8 on a 0-10 pain scale, localized to posterior oropharynx, described as aching, worse with swallowing, no alleviating factors identified.  She has not been taking any over-the-counter medication for symptom management.  She has not had COVID in the past.  She has not had COVID-19 vaccines.  She does have a history of allergies managed with as needed over-the-counter medications; has not been using these frequently.  Denies history of smoking, asthma, COPD.  She is confident she is not pregnant. ? ? ?History reviewed. No pertinent past medical history. ? ?There are no problems to display for this patient. ? ? ?History reviewed. No pertinent surgical history. ? ?OB History   ?No obstetric history on file. ?  ? ? ? ?Home Medications   ? ?Prior to Admission medications   ?Medication Sig Start Date End Date Taking? Authorizing Provider  ?promethazine-dextromethorphan (PROMETHAZINE-DM) 6.25-15 MG/5ML syrup Take 5 mLs by mouth 4 (four) times daily as needed for cough. 06/12/21  Yes Idan Prime K, PA-C  ?fluticasone (FLONASE) 50 MCG/ACT nasal spray Place 1 spray into both nostrils daily. 06/12/21   Boleslaw Borghi, Derry Skill, PA-C  ?omeprazole (PRILOSEC) 40 MG capsule Take 1 capsule (40 mg total) by mouth daily. 08/06/13   Gregor Hams, MD  ?ondansetron [facility]) 4 MG tablet One tablet po q  6h prn stomach sickness 04/29/14   Janne Napoleon, NP  ?sucralfate (CARAFATE) 1 G tablet Take 1 tablet (1 g total) by mouth 4 (four) times daily -  with meals and at bedtime. 08/06/13   Gregor Hams, MD  ? ? ?Family History ?History reviewed. No pertinent family history. ? ?Social History ?Social History  ? ?Tobacco Use  ? Smoking status: Never  ?Substance Use Topics  ? Alcohol use: No  ? Drug use: No  ? ? ? ?Allergies   ?Patient has no known allergies. ? ? ?Review of Systems ?Review of Systems  ?Constitutional:  Positive for activity change and fatigue. Negative for appetite change and fever.  ?HENT:  Positive for sore throat and trouble swallowing. Negative for congestion, sinus pressure, sneezing and voice change.   ?Respiratory:  Positive for cough. Negative for shortness of breath.   ?Cardiovascular:  Negative for chest pain.  ?Gastrointestinal:  Negative for abdominal pain, diarrhea, nausea and vomiting.  ?Musculoskeletal:  Positive for arthralgias and myalgias.  ?Neurological:  Positive for headaches. Negative for dizziness and light-headedness.  ? ? ?Physical Exam ?Triage Vital Signs ?ED Triage Vitals  ?Enc Vitals Group  ?   BP 06/12/21 1422 125/88  ?   Pulse Rate 06/12/21 1422 81  ?   Resp 06/12/21 1422 18  ?   Temp 06/12/21 1422 98 ?F (36.7 ?C)  ?   Temp src --   ?   SpO2 06/12/21 1422 98 %  ?  Weight --   ?   Height --   ?   Head Circumference --   ?   Peak Flow --   ?   Pain Score 06/12/21 1417 8  ?   Pain Loc --   ?   Pain Edu? --   ?   Excl. in [place]? --   ? ?No data found. ? ?Updated Vital Signs ?BP 125/88   Pulse 81   Temp 98 ?F (36.7 ?C)   Resp 18   LMP 05/29/2021 (Approximate)   SpO2 98%  ? ?Visual Acuity ?Right Eye Distance:   ?Left Eye Distance:   ?Bilateral Distance:   ? ?Right Eye Near:   ?Left Eye Near:    ?Bilateral Near:    ? ?Physical Exam ?Vitals reviewed.  ?Constitutional:   ?   General: She is awake. She is not in acute distress. ?   Appearance: Normal appearance. She is well-developed.  She is not ill-appearing.  ?   Comments: Very pleasant female appears stated age in no acute distress sitting comfortably in exam room  ?HENT:  ?   Head: Normocephalic and atraumatic.  ?   Right Ear: Tympanic membrane, ear canal and external ear normal. Tympanic membrane is not erythematous or bulging.  ?   Left Ear: Tympanic membrane, ear canal and external ear normal. Tympanic membrane is not erythematous or bulging.  ?   Nose: Congestion present.  ?   Right Sinus: No maxillary sinus tenderness or frontal sinus tenderness.  ?   Left Sinus: No maxillary sinus tenderness or frontal sinus tenderness.  ?   Mouth/Throat:  ?   Pharynx: Uvula midline. Posterior oropharyngeal erythema present. No oropharyngeal exudate.  ?   Tonsils: No tonsillar exudate or tonsillar abscesses.  ?Cardiovascular:  ?   Rate and Rhythm: Normal rate and regular rhythm.  ?   Heart sounds: Normal heart sounds, S1 normal and S2 normal. No murmur heard. ?Pulmonary:  ?   Effort: Pulmonary effort is normal.  ?   Breath sounds: Normal breath sounds. No wheezing, rhonchi or rales.  ?   Comments: Clear to auscultation bilaterally ?Lymphadenopathy:  ?   Head:  ?   Right side of head: Submandibular adenopathy present. No submental or tonsillar adenopathy.  ?   Left side of head: No submental, submandibular or tonsillar adenopathy.  ?   Cervical: No cervical adenopathy.  ?Psychiatric:     ?   Behavior: Behavior is cooperative.  ? ? ? ?UC Treatments / Results  ?Labs ?(all labs ordered are listed, but only abnormal results are displayed) ?Labs Reviewed  ?SARS CORONAVIRUS 2 (TAT 6-24 HRS)  ?POCT RAPID STREP A, ED / UC  ?POC INFLUENZA A AND B ANTIGEN (URGENT CARE ONLY)  ? ? ?EKG ? ? ?Radiology ?No results found. ? ?Procedures ?Procedures (including critical care time) ? ?Medications Ordered in UC ?Medications - No data to display ? ?Initial Impression / Assessment and Plan / UC Course  ?I have reviewed the triage vital signs and the nursing  notes. ? ?Pertinent labs & imaging results that were available during my care of the patient were reviewed by me and considered in my medical decision making (see chart for details). ? ?  ? ?Patient is well-appearing, afebrile, nontachycardic, nontoxic.  Discussed likely viral etiology of symptoms.  No evidence of acute infection on physical exam that would warrant initiation of antibiotics.  Strep and flu testing were negative in clinic.  COVID testing is pending.  She was provided work excuse note with current CDC return to work guidelines based on COVID test result.  She was prescribed Promethazine DM for cough with instruction not to drive or drink alcohol with taking it.  Recommended she use Mucinex, Flonase, Tylenol for additional symptom relief.  She is to gargle with warm salt water for sore throat relief.  Discussed that if she has any worsening symptoms including high fever, chest pain, shortness of breath, nausea/vomiting, difficulty swallowing, muffled voice, swelling of her throat she needs to go to the emergency room.  If symptoms or not improving by next week she is to return here or see your PCP.  Strict return precautions given to which she expressed understanding. ? ?Final Clinical Impressions(s) / UC Diagnoses  ? ?Final diagnoses:  ?Upper respiratory tract infection, unspecified type  ?Acute cough  ?Sore throat  ? ? ? ?Discharge Instructions   ? ?  ?Your testing was negative.  We will contact you if your COVID test is positive.  Use over-the-counter medications including Mucinex and Tylenol for symptom relief.  Use Promethazine DM for cough.  This will make you sleepy so do not drive or drink alcohol with taking it.  Use Flonase for nasal congestion.  Gargle with warm salt water.  If your symptoms or not improving or if at any point it worsens and you develop high fever not responding to medication, chest pain, shortness of breath, worsening cough, difficulty swallowing, swelling of your throat,  muffled voice you need to be seen in the emergency room.  If symptoms are not improving by next week please return here or see your PCP for reevaluation. ? ? ? ? ?ED Prescriptions   ? ? Medication Sig Dispense Auth. Provider

## 2021-06-12 NOTE — ED Triage Notes (Signed)
Pt reports Ha ,cough ,fever and sore throat started 4 days ago. ?

## 2021-06-12 NOTE — Discharge Instructions (Signed)
Your testing was negative.  We will contact you if your COVID test is positive.  Use over-the-counter medications including Mucinex and Tylenol for symptom relief.  Use Promethazine DM for cough.  This will make you sleepy so do not drive or drink alcohol with taking it.  Use Flonase for nasal congestion.  Gargle with warm salt water.  If your symptoms or not improving or if at any point it worsens and you develop high fever not responding to medication, chest pain, shortness of breath, worsening cough, difficulty swallowing, swelling of your throat, muffled voice you need to be seen in the emergency room.  If symptoms are not improving by next week please return here or see your PCP for reevaluation. ?

## 2021-06-13 LAB — SARS CORONAVIRUS 2 (TAT 6-24 HRS): SARS Coronavirus 2: NEGATIVE

## 2021-08-26 ENCOUNTER — Encounter (HOSPITAL_COMMUNITY): Payer: Self-pay | Admitting: Emergency Medicine

## 2021-08-26 ENCOUNTER — Ambulatory Visit (HOSPITAL_COMMUNITY)
Admission: EM | Admit: 2021-08-26 | Discharge: 2021-08-26 | Disposition: A | Discharge status: Home / Self Care | Payer: 59 | Attending: Internal Medicine | Admitting: Internal Medicine

## 2021-08-26 DIAGNOSIS — J029 Acute pharyngitis, unspecified: Secondary | ICD-10-CM

## 2021-08-26 DIAGNOSIS — R509 Fever, unspecified: Secondary | ICD-10-CM | POA: Diagnosis not present

## 2021-08-26 DIAGNOSIS — J039 Acute tonsillitis, unspecified: Secondary | ICD-10-CM | POA: Diagnosis not present

## 2021-08-26 LAB — POCT RAPID STREP A, ED / UC: Streptococcus, Group A Screen (Direct): NEGATIVE

## 2021-08-26 MED ORDER — AMOXICILLIN-POT CLAVULANATE 875-125 MG PO TABS: 1.0000 | ORAL_TABLET | Freq: Two times a day (BID) | ORAL | 0 refills | Status: DC

## 2021-08-26 NOTE — ED Triage Notes (Signed)
Sore throat, ear pain, and post nasal drip x 3 days with chills. Denies cough

## 2021-08-26 NOTE — Discharge Instructions (Signed)
Take Augmentin twice daily for 7 days.  Gargle with warm salt water and alternate 10 ibuprofen for symptom relief.  If symptoms do not improving within 24 hours please return for reevaluation.  If any point you develop a rash please be seen so we can test you for mono.  If your symptoms continue to recur I do recommend that you follow-up with ENT; call to schedule an appointment.  If you have high fever, difficulty swallowing, shortness of breath, swelling of your throat, muffled voice you need to go to the emergency room immediately.

## 2021-08-26 NOTE — ED Provider Notes (Signed)
[facility]    CSN: [id number] Arrival date & time: 08/26/21  1040      History   Chief Complaint Chief Complaint  Patient presents with   Sore Throat    HPI Denise Barron is a 46 y.o. female.   Patient presents today with several day history of sore throat, bilateral otalgia, subjective fever, fatigue, malaise.  She has been me speaking and video interpreter is utilized during visit.  She denies cough, chest pain, shortness of breath, nausea, vomiting.  She has tried DayQuil and NyQuil without improvement of symptoms.  Denies any known sick contacts.  Denies any recent antibiotics.  She is confident that she is not pregnant.  She is eating and drinking despite symptoms.  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  Denies any throat swelling, muffled voice, dysphagia, shortness of breath.  Pain is rated 10 on a 0-10 pain scale, localized to posterior pharynx, described as sharp, worse with swallowing, no alleviating factors identified.   History reviewed. No pertinent past medical history.  There are no problems to display for this patient.   History reviewed. No pertinent surgical history.  OB History   No obstetric history on file.      Home Medications    Prior to Admission medications   Medication Sig Start Date End Date Taking? Authorizing Provider  amoxicillin-clavulanate (AUGMENTIN) 875-125 MG tablet Take 1 tablet by mouth every 12 (twelve) hours. 08/26/21  Yes Kamarie Palma K, PA-C  fluticasone (FLONASE) 50 MCG/ACT nasal spray Place 1 spray into both nostrils daily. 06/12/21   Kamill Fulbright, Noberto Retort, PA-C  omeprazole (PRILOSEC) 40 MG capsule Take 1 capsule (40 mg total) by mouth daily. 08/06/13   Rodolph Bong, MD  ondansetron (ZOFRAN) 4 MG tablet One tablet po q 6h prn stomach sickness 04/29/14   Hayden Rasmussen, NP  promethazine-dextromethorphan (PROMETHAZINE-DM) 6.25-15 MG/5ML syrup Take 5  mLs by mouth 4 (four) times daily as needed for cough. 06/12/21   Lashane Whelpley, Noberto Retort, PA-C  sucralfate (CARAFATE) 1 G tablet Take 1 tablet (1 g total) by mouth 4 (four) times daily -  with meals and at bedtime. 08/06/13   Rodolph Bong, MD    Family History History reviewed. No pertinent family history.  Social History Social History   Tobacco Use   Smoking status: Never  Substance Use Topics   Alcohol use: No   Drug use: No     Allergies   Patient has no known allergies.   Review of Systems Review of Systems  Constitutional:  Positive for activity change, fatigue and fever. Negative for appetite change.  HENT:  Positive for sore throat and trouble swallowing. Negative for congestion, sinus pressure, sneezing and voice change.   Respiratory:  Negative for cough and shortness of breath.   Cardiovascular:  Negative for chest pain.  Gastrointestinal:  Negative for abdominal pain, diarrhea, nausea and vomiting.  Neurological:  Negative for dizziness, light-headedness and headaches.    Physical Exam Triage Vital Signs ED Triage Vitals  Enc Vitals Group     BP 08/26/21 1142 139/79     Pulse Rate 08/26/21 1142 (!) 103     Resp 08/26/21 1142 16     Temp 08/26/21 1142 100 F (37.8 C)     Temp Source 08/26/21 1142 Oral     SpO2 08/26/21 1142 95 %     Weight --  Height --      Head Circumference --      Peak Flow --      Pain Score 08/26/21 1143 10     Pain Loc --      Pain Edu? --      Excl. in [place]? --    No data found.  Updated Vital Signs BP 139/79 (BP Location: Right Arm)   Pulse (!) 103   Temp 100 F (37.8 C) (Oral)   Resp 16   SpO2 95%   Visual Acuity Right Eye Distance:   Left Eye Distance:   Bilateral Distance:    Right Eye Near:   Left Eye Near:    Bilateral Near:     Physical Exam Vitals reviewed.  Constitutional:      General: She is awake. She is not in acute distress.    Appearance: Normal appearance. She is well-developed. She is not  ill-appearing.     Comments: Very pleasant female appears stated age in no acute distress sitting comfortably in exam room  HENT:     Head: Normocephalic and atraumatic.     Right Ear: Tympanic membrane, ear canal and external ear normal. Tympanic membrane is not erythematous or bulging.     Left Ear: Tympanic membrane, ear canal and external ear normal. Tympanic membrane is not erythematous or bulging.     Nose:     Right Sinus: No maxillary sinus tenderness or frontal sinus tenderness.     Left Sinus: No maxillary sinus tenderness or frontal sinus tenderness.     Mouth/Throat:     Pharynx: Uvula midline. Posterior oropharyngeal erythema present. No oropharyngeal exudate.     Tonsils: Tonsillar exudate present. No tonsillar abscesses. 1+ on the right. 1+ on the left.  Cardiovascular:     Rate and Rhythm: Normal rate and regular rhythm.     Heart sounds: Normal heart sounds, S1 normal and S2 normal. No murmur heard. Pulmonary:     Effort: Pulmonary effort is normal.     Breath sounds: Normal breath sounds. No wheezing, rhonchi or rales.     Comments: Clear to auscultation bilaterally Lymphadenopathy:     Head:     Right side of head: No submental, submandibular or tonsillar adenopathy.     Left side of head: No submental, submandibular or tonsillar adenopathy.     Cervical: No cervical adenopathy.  Psychiatric:        Behavior: Behavior is cooperative.     UC Treatments / Results  Labs (all labs ordered are listed, but only abnormal results are displayed) Labs Reviewed  POCT RAPID STREP A, ED / UC    EKG   Radiology No results found.  Procedures Procedures (including critical care time)  Medications Ordered in UC Medications - No data to display  Initial Impression / Assessment and Plan / UC Course  I have reviewed the triage vital signs and the nursing notes.  Pertinent labs & imaging results that were available during my care of the patient were reviewed by me and  considered in my medical decision making (see chart for details).     Strep testing was negative in clinic today.  Given clinical presentation concern for exudative tonsillitis.  She was started on Augmentin twice daily for 10 days.  She is low risk for mono so testing was deferred particular she has only been symptomatic for a few days.  Discussed that if she develops any rash she should stop the medication and be  seen immediately to be tested for mono.  She is to gargle with warm salt water and alternate Tylenol and ibuprofen for pain.  Discussed that if symptoms or not improving she should return for reevaluation.  If anything worsens and she has high fever not responding to medication, dysphagia, odynophagia, muffled voice, swelling of her throat she needs to go to the emergency room to which she expressed understanding.  Final Clinical Impressions(s) / UC Diagnoses   Final diagnoses:  Exudative tonsillitis  Sore throat  Fever, unspecified     Discharge Instructions      Take Augmentin twice daily for 7 days.  Gargle with warm salt water and alternate 10 ibuprofen for symptom relief.  If symptoms do not improving within 24 hours please return for reevaluation.  If any point you develop a rash please be seen so we can test you for mono.  If your symptoms continue to recur I do recommend that you follow-up with ENT; call to schedule an appointment.  If you have high fever, difficulty swallowing, shortness of breath, swelling of your throat, muffled voice you need to go to the emergency room immediately.     ED Prescriptions     Medication Sig Dispense Auth. Provider   amoxicillin-clavulanate (AUGMENTIN) 875-125 MG tablet Take 1 tablet by mouth every 12 (twelve) hours. 14 tablet Masha Orbach, Noberto Retort, PA-C      PDMP not reviewed this encounter.   Jeani Hawking, PA-C 08/26/21 1214

## 2022-04-04 ENCOUNTER — Ambulatory Visit (HOSPITAL_COMMUNITY)
Admission: EM | Admit: 2022-04-04 | Discharge: 2022-04-04 | Disposition: A | Discharge status: Home / Self Care | Payer: BLUE CROSS/BLUE SHIELD | Attending: Family Medicine | Admitting: Family Medicine

## 2022-04-04 ENCOUNTER — Encounter (HOSPITAL_COMMUNITY): Payer: Self-pay

## 2022-04-04 DIAGNOSIS — J029 Acute pharyngitis, unspecified: Secondary | ICD-10-CM

## 2022-04-04 DIAGNOSIS — J101 Influenza due to other identified influenza virus with other respiratory manifestations: Secondary | ICD-10-CM

## 2022-04-04 DIAGNOSIS — R051 Acute cough: Secondary | ICD-10-CM

## 2022-04-04 LAB — POCT RAPID STREP A, ED / UC: Streptococcus, Group A Screen (Direct): NEGATIVE

## 2022-04-04 LAB — POC INFLUENZA A AND B ANTIGEN (URGENT CARE ONLY)
INFLUENZA A ANTIGEN, POC: POSITIVE — AB
INFLUENZA B ANTIGEN, POC: NEGATIVE

## 2022-04-04 MED ORDER — HYDROCODONE BIT-HOMATROP MBR 5-1.5 MG/5ML PO SOLN: 5.0000 mL | Freq: Four times a day (QID) | ORAL | 0 refills | Status: AC | PRN

## 2022-04-04 MED ORDER — ACETAMINOPHEN 325 MG PO TABS
ORAL_TABLET | ORAL | Status: AC
  Filled 2022-04-04: qty 3

## 2022-04-04 MED ORDER — LIDOCAINE VISCOUS HCL 2 % MT SOLN: 5.0000 mL | Freq: Four times a day (QID) | OROMUCOSAL | 0 refills | Status: AC | PRN

## 2022-04-04 MED ORDER — OSELTAMIVIR PHOSPHATE 75 MG PO CAPS: 75.0000 mg | ORAL_CAPSULE | Freq: Two times a day (BID) | ORAL | 0 refills | Status: AC

## 2022-04-04 MED ORDER — ACETAMINOPHEN 325 MG PO TABS
975.0000 mg | ORAL_TABLET | Freq: Once | ORAL | Status: AC
  Administered 2022-04-04: 975 mg via ORAL

## 2022-04-04 MED ORDER — FLUTICASONE PROPIONATE 50 MCG/ACT NA SUSP: 1.0000 | Freq: Every day | NASAL | 0 refills | Status: AC

## 2022-04-04 NOTE — ED Provider Notes (Addendum)
[facility]   [id number] 04/04/22 Arrival Time: 5102  ASSESSMENT & PLAN:  1. Influenza A   2. Acute cough   3. Sore throat    Discussed typical duration of influenza. See AVS for d/c instructions.  Results for orders placed or performed during the hospital encounter of 04/04/22  POCT Rapid Strep A  Result Value Ref Range   Streptococcus, Group A Screen (Direct) NEGATIVE NEGATIVE  POC Influenza A & B Ag (Urgent Care)  Result Value Ref Range   INFLUENZA A ANTIGEN, POC POSITIVE (A) NEGATIVE   INFLUENZA B ANTIGEN, POC NEGATIVE NEGATIVE   Rapid strep negative. Throat culture sent. OTC symptom care as needed.  Discharge Medication List as of 04/04/2022 12:27 PM     START taking these medications   Details  HYDROcodone bit-homatropine (HYCODAN) 5-1.5 MG/5ML syrup Take 5 mLs by mouth every 6 (six) hours as needed for cough., Starting Wed 04/04/2022, Normal    magic mouthwash (lidocaine, diphenhydrAMINE, alum & mag hydroxide) suspension Swish and spit 5 mLs 4 (four) times daily as needed for mouth pain., Starting Wed 04/04/2022, Normal    oseltamivir (TAMIFLU) 75 MG capsule Take 1 capsule (75 mg total) by mouth 2 (two) times daily for 5 days., Starting Wed 04/04/2022, Until Mon 04/09/2022, Normal         Follow-up Information     Lucianne Lei, MD.   Specialty: Family Medicine Why: As needed. Contact information: [address] [place] [address] [phone]         [facility].   Specialty: Urgent Care Why: If worsening or failing to improve as anticipated. Contact information: [place] [address] [phone]               Work note provided.  Reviewed expectations re: course of current medical issues. Questions answered. Outlined signs and symptoms indicating need for more acute intervention. Understanding verbalized. After Visit Summary given.   SUBJECTIVE: History from:  Patient and Family. Denise Barron is a 47 y.o. female.  Chief Complaint: sore throat and pain with swallowing. Cough that is dry. Fever and chills, body aches as well.  Past 2-3 d. No SOB/CP. Nyquil with some relief. Husband sick before her; better now. Denies: headache. Normal PO intake without n/v/d.  OBJECTIVE:  Vitals:   04/04/22 1152 04/04/22 1157  BP: 126/85   Pulse: (!) 105   Resp: 20   Temp: (!) 103 F (39.4 C)   TempSrc: Oral   SpO2: 95%   Weight:  65.8 kg  Height:  5' (1.524 m)    Fever and slight tachycardia noted.  General appearance: alert; no distress Eyes: PERRLA; EOMI; conjunctiva normal HENT: [place]; AT; with nasal congestion; throat with mild/mod erythema and irritation Neck: supple without LAD Lungs: speaks full sentences without difficulty; unlabored; ctab; active cough Extremities: no edema Skin: warm and dry Neurologic: normal gait Psychological: alert and cooperative; normal mood and affect  Labs: Results for orders placed or performed during the hospital encounter of 04/04/22  POCT Rapid Strep A  Result Value Ref Range   Streptococcus, Group A Screen (Direct) NEGATIVE NEGATIVE  POC Influenza A & B Ag (Urgent Care)  Result Value Ref Range   INFLUENZA A ANTIGEN, POC POSITIVE (A) NEGATIVE   INFLUENZA B ANTIGEN, POC NEGATIVE NEGATIVE   Labs Reviewed  POC INFLUENZA A AND B ANTIGEN (URGENT CARE ONLY) - Abnormal; Notable for the following components:  Result Value   INFLUENZA A ANTIGEN, POC POSITIVE (*)    All other components within normal limits  POCT RAPID STREP A, ED / UC    Imaging: No results found.  No Known Allergies  History reviewed. No pertinent past medical history. Social History   Socioeconomic History   Marital status: Married    Spouse name: Not on file   Number of children: Not on file   Years of education: Not on file   Highest education level: Not on file  Occupational History   Not on file  Tobacco Use   Smoking  status: Never   Smokeless tobacco: Not on file  Substance and Sexual Activity   Alcohol use: No   Drug use: No   Sexual activity: Never  Other Topics Concern   Not on file  Social History Narrative   Not on file   Social Determinants of Health   Financial Resource Strain: Not on file  Food Insecurity: Not on file  Transportation Needs: Not on file  Physical Activity: Not on file  Stress: Not on file  Social Connections: Not on file  Intimate Partner Violence: Not on file   History reviewed. No pertinent family history. History reviewed. No pertinent surgical history.   Vanessa Kick, MD 04/04/22 1234    Vanessa Kick, MD 04/04/22 1235

## 2022-04-04 NOTE — Discharge Instructions (Signed)

## 2022-04-04 NOTE — ED Triage Notes (Signed)
Chief Complaint: sore throat and pain with swallowing. Cough that is dry. Fever and chills, body aches as well.   Onset: 3 days, worse yesterday   Prescriptions or OTC medications tried: Yes- day and nyquil     with no relief  Sick exposure: Yes- granddaughter and husband sick. No positive testing done   New foods, medications, or products: No  Recent Travel: No
